# Patient Record
Sex: Female | Born: 1960 | Race: Black or African American | Hispanic: No | Marital: Married | Smoking: Former smoker
Health system: Southern US, Community
[De-identification: ages and names within clinical notes are randomized; demographics above are authoritative.]

## PROBLEM LIST (undated history)

## (undated) DIAGNOSIS — I639 Cerebral infarction, unspecified: Secondary | ICD-10-CM

## (undated) DIAGNOSIS — I10 Essential (primary) hypertension: Secondary | ICD-10-CM

## (undated) DIAGNOSIS — E119 Type 2 diabetes mellitus without complications: Secondary | ICD-10-CM

## (undated) DIAGNOSIS — E785 Hyperlipidemia, unspecified: Secondary | ICD-10-CM

## (undated) HISTORY — PX: COLONOSCOPY: SHX174

---

## 1984-01-07 HISTORY — PX: TUBAL LIGATION: SHX77

## 2003-09-08 ENCOUNTER — Other Ambulatory Visit: Admission: RE | Admit: 2003-09-08 | Discharge: 2003-09-08 | Payer: Self-pay | Admitting: Gynecology

## 2007-02-22 ENCOUNTER — Other Ambulatory Visit: Admission: RE | Admit: 2007-02-22 | Discharge: 2007-02-22 | Payer: Self-pay | Admitting: Gynecology

## 2008-10-13 ENCOUNTER — Ambulatory Visit: Payer: Self-pay | Admitting: Women's Health

## 2008-10-13 ENCOUNTER — Other Ambulatory Visit: Admission: RE | Admit: 2008-10-13 | Discharge: 2008-10-13 | Payer: Self-pay | Admitting: Gynecology

## 2008-10-13 ENCOUNTER — Encounter: Payer: Self-pay | Admitting: Women's Health

## 2009-09-07 ENCOUNTER — Emergency Department: Payer: Self-pay | Admitting: Emergency Medicine

## 2009-10-11 ENCOUNTER — Encounter: Payer: Self-pay | Admitting: Gastroenterology

## 2009-10-31 ENCOUNTER — Encounter (INDEPENDENT_AMBULATORY_CARE_PROVIDER_SITE_OTHER): Payer: Self-pay | Admitting: *Deleted

## 2009-11-05 ENCOUNTER — Ambulatory Visit: Payer: Self-pay | Admitting: Gastroenterology

## 2009-11-14 ENCOUNTER — Ambulatory Visit: Payer: Self-pay | Admitting: Gastroenterology

## 2009-11-19 ENCOUNTER — Encounter: Payer: Self-pay | Admitting: Gastroenterology

## 2010-02-05 NOTE — Letter (Signed)
Summary: Pre Visit Letter Revised  [facility]  [address] [place], [place] [address]   Phone: [phone]  Fax: [phone]        10/11/2009 MRN: [id number] [name] [address] [place], [place]  [address]             Procedure Date:  11-9 at 9:30am    Welcome to the Gastroenterology Division at [facility].    You are scheduled to see a nurse for your pre-procedure visit on 11-02-09 at 11am on the 3rd floor at [facility], [address]  We ask that you try to arrive at our office 15 minutes prior to your appointment time to allow for check-in.  Please take a minute to review the attached form.  If you answer "Yes" to one or more of the questions on the first page, we ask that you call the person listed at your earliest opportunity.  If you answer "No" to all of the questions, please complete the rest of the form and bring it to your appointment.    Your nurse visit will consist of discussing your medical and surgical history, your immediate family medical history, and your medications.   If you are unable to list all of your medications on the form, please bring the medication bottles to your appointment and we will list them.  We will need to be aware of both prescribed and over the counter drugs.  We will need to know exact dosage information as well.    Please be prepared to read and sign documents such as consent forms, a financial agreement, and acknowledgement forms.  If necessary, and with your consent, a friend or relative is welcome to sit-in on the nurse visit with you.  Please bring your insurance card so that we may make a copy of it.  If your insurance requires a referral to see a specialist, please bring your referral form from your primary care physician.  No co-pay is required for this nurse visit.     If you cannot keep your appointment, please call [phone] to cancel or reschedule prior to your appointment date.  This  allows [place] the opportunity to schedule an appointment for another patient in need of care.    Thank you for choosing [facility] for your medical needs.  We appreciate the opportunity to care for you.  Please visit [place] at our website  to learn more about our practice.  Sincerely, The Gastroenterology Division

## 2010-02-05 NOTE — Miscellaneous (Signed)
Summary: [facility] Previsit/prep  Clinical Lists Changes  Medications: Added new medication of MOVIPREP 100 GM  SOLR (PEG-KCL-NACL-NASULF-NA ASC-C) As per prep instructions. - Signed Rx of MOVIPREP 100 GM  SOLR (PEG-KCL-NACL-NASULF-NA ASC-C) As per prep instructions.;  #1 x 0;  Signed;  Entered by: Wyona Almas RN;  Authorized by: Mardella Layman MD [facility];  Method used: Electronically to [facility]. [facility] [phone]*, [address] [place], [place]  [id number], Ph: [phone], Fax: [phone] Observations: Added new observation of NKA: T (11/05/2009 13:34)    Prescriptions: MOVIPREP 100 GM  SOLR (PEG-KCL-NACL-NASULF-NA ASC-C) As per prep instructions.  #1 x 0   Entered by:   Wyona Almas RN   Authorized by:   Mardella Layman MD [facility]   Signed by:   Wyona Almas RN on 11/05/2009   Method used:   Electronically to        [facility]. [address] [phone]* (retail)       [address] [place], [place]  [id number]       Ph: [phone]       Fax: [phone]   RxID:   [id number]

## 2010-02-05 NOTE — Letter (Signed)
Summary: Patient Notice- Polyp Results   Gastroenterology  [address] [place], [place] [address]   Phone: [phone]  Fax: [phone]        November 19, 2009 MRN: [id number]    Anne Roy [address] [place], [place]  [address]    Dear Ms. Pressly,  I am pleased to inform you that the colon polyp(s) removed during your recent colonoscopy was (were) found to be benign (no cancer detected) upon pathologic examination.  I recommend you have a repeat colonoscopy examination in 1_ years to look for recurrent polyps, as having colon polyps increases your risk for having recurrent polyps or even colon cancer in the future.  Should you develop new or worsening symptoms of abdominal pain, bowel habit changes or bleeding from the rectum or bowels, please schedule an evaluation with either your primary care physician or with me.  Additional information/recommendations:  _X_ No further action with gastroenterology is needed at this time. Please      follow-up with your primary care physician for your other healthcare      needs.  __ Please call [phone] to schedule a return visit to review your      situation.  __ Please keep your follow-up visit as already scheduled.  __ Continue treatment plan as outlined the day of your exam.  Please call us if you are having persistent problems or have questions about your condition that have not been fully answered at this time.  Sincerely,  Mardella Layman MD [facility]  This letter has been electronically signed by your physician.  Appended Document: Patient Notice- Polyp Results letter mailed

## 2010-02-05 NOTE — Procedures (Signed)
Summary: Colonoscopy  Patient: [name] Note: All result statuses are Final unless otherwise noted.  Tests: (1) Colonoscopy (COL)   COL Colonoscopy           DONE (C)     [facility]     [address]     [place], [place]  [address]           COLONOSCOPY PROCEDURE REPORT           PATIENT:  Anne Roy, Anne Roy  MR#:  [id number]     BIRTHDATE:  [birth year], 49 yrs. old  GENDER:  female     ENDOSCOPIST:  Vania Rea. Jarold Motto, MD, [facility]     REF. BY:  Tomi Bamberger, NP     PROCEDURE DATE:  11/14/2009     PROCEDURE:  Colonoscopy with snare polypectomy     ASA CLASS:  Class I     INDICATIONS:  Routine Risk Screening     MEDICATIONS:   Fentanyl 50 mcg IV, Versed 6 mg IV           DESCRIPTION OF PROCEDURE:   After the risks benefits and     alternatives of the procedure were thoroughly explained, informed     consent was obtained.  Digital rectal exam was performed and     revealed no abnormalities.   The LB PCF-H180AL [id number] endoscope     was introduced through the anus and advanced to the cecum, which     was identified by both the appendix and ileocecal valve, without     limitations.  The quality of the prep was excellent, using     MoviPrep.  The instrument was then slowly withdrawn as the colon     was fully examined.     <<PROCEDUREIMAGES>>           FINDINGS:  There were multiple polyps identified and removed.     throughout the colon. 5-10 MM LAT POLYPS HOT SNARE EXCISED FROM     RIGHT AND LEFT COLON.SEE PICTURES,  This was otherwise a normal     examination of the colon.   Retroflexed views in the rectum     revealed no abnormalities.    The scope was then withdrawn from     the patient and the procedure completed.           COMPLICATIONS:  None     ENDOSCOPIC IMPRESSION:     1) Polyps, multiple throughout the colon     2) Otherwise normal examination     ?? HNPCC SYNDROME PER MULTIPLE POLYPS.F/U 1 YEAR.     RECOMMENDATIONS:     EXAM 1 YEAR.GYN EXAM IF NOT DONE.  REPEAT EXAM:  No           ______________________________     Vania Rea. Jarold Motto, MD, Clementeen Graham           CC:  Rudi Heap, MDNancy Maple Hudson, NPFuller, Darl Pikes NP           n.     REVISED:  11/14/2009 10:39 AM     eSIGNED:   Vania Rea. Teodora Baumgarten at 11/14/2009 10:39 AM           Luiz Iron, [id number]  Note: An exclamation mark (!) indicates a result that was not dispersed into the flowsheet. Document Creation Date: 11/14/2009 10:39 AM _______________________________________________________________________  (1) Order result status: Final Collection or observation date-time: 11/14/2009 09:51 Requested date-time:  Receipt date-time:  Reported date-time:  Referring Physician:  Ordering Physician: Sheryn Bison [phone]) Specimen Source:  Source: Launa Grill Order Number: [id number] Lab site:   Appended Document: Colonoscopy Follow up 1 Y  Appended Document: Colonoscopy     Procedures Next Due Date:    Colonoscopy: 11/2010

## 2010-02-05 NOTE — Letter (Signed)
Summary: [facility] Instructions  [facility]  [address] [place], [place] [address]   Phone: [phone]  Fax: [phone]       Anne Roy    March 09, 1952    MRN: [id number]        Procedure Day Anne Roy:  [facility]  11/14/09     Arrival Time:  8:30AM     Procedure Time:  9:30AM     Location of Procedure:                    [name]  [facility] (4th Floor)  PREPARATION FOR COLONOSCOPY WITH MOVIPREP   Starting 5 days prior to your procedure 11/09/09 do not eat nuts, seeds, popcorn, corn, beans, peas,  salads, or any raw vegetables.  Do not take any fiber supplements (e.g. Metamucil, Citrucel, and Benefiber).  THE DAY BEFORE YOUR PROCEDURE         DATE: 11/13/09  DAY: TUESDAY  1.  Drink clear liquids the entire day-NO SOLID FOOD  2.  Do not drink anything colored red or purple.  Avoid juices with pulp.  No orange juice.  3.  Drink at least 64 oz. (8 glasses) of fluid/clear liquids during the day to prevent dehydration and help the prep work efficiently.  CLEAR LIQUIDS INCLUDE: Water Jello Ice Popsicles Tea (sugar ok, no milk/cream) Powdered fruit flavored drinks Coffee (sugar ok, no milk/cream) Gatorade Juice: apple, white grape, white cranberry  Lemonade Clear bullion, consomm, broth Carbonated beverages (any kind) Strained chicken noodle soup Hard Candy                             4.  In the morning, mix first dose of MoviPrep solution:    Empty 1 Pouch A and 1 Pouch B into the disposable container    Add lukewarm drinking water to the top line of the container. Mix to dissolve    Refrigerate (mixed solution should be used within 24 hrs)  5.  Begin drinking the prep at 5:00 p.m. The MoviPrep container is divided by 4 marks.   Every 15 minutes drink the solution down to the next mark (approximately 8 oz) until the full liter is complete.   6.  Follow completed prep with 16 oz of clear liquid of your choice (Nothing red or purple).   Continue to drink clear liquids until bedtime.  7.  Before going to bed, mix second dose of MoviPrep solution:    Empty 1 Pouch A and 1 Pouch B into the disposable container    Add lukewarm drinking water to the top line of the container. Mix to dissolve    Refrigerate  THE DAY OF YOUR PROCEDURE      DATE: 11/14/09   DAY: WEDNESDAY  Beginning at 4:30AM (5 hours before procedure):         1. Every 15 minutes, drink the solution down to the next mark (approx 8 oz) until the full liter is complete.  2. Follow completed prep with 16 oz. of clear liquid of your choice.    3. You may drink clear liquids until 7:30AM (2 HOURS BEFORE PROCEDURE).   MEDICATION INSTRUCTIONS  Unless otherwise instructed, you should take regular prescription medications with a small sip of water   as early as possible the morning of your procedure.       OTHER INSTRUCTIONS  You will need a responsible adult at least 52  years of age to accompany you and drive you home.   This person must remain in the waiting room during your procedure.  Wear loose fitting clothing that is easily removed.  Leave jewelry and other valuables at home.  However, you may wish to bring a book to read or  an iPod/MP3 player to listen to music as you wait for your procedure to start.  Remove all body piercing jewelry and leave at home.  Total time from sign-in until discharge is approximately 2-3 hours.  You should go home directly after your procedure and rest.  You can resume normal activities the  day after your procedure.  The day of your procedure you should not:   Drive   Make legal decisions   Operate machinery   Drink alcohol   Return to work  You will receive specific instructions about eating, activities and medications before you leave.    The above instructions have been reviewed and explained to me by   Wyona Almas RN  November 05, 2009 2:37 PM     I fully understand and can verbalize these  instructions _____________________________ Date _________

## 2011-01-17 ENCOUNTER — Other Ambulatory Visit (HOSPITAL_COMMUNITY)
Admission: RE | Admit: 2011-01-17 | Discharge: 2011-01-17 | Disposition: A | Discharge status: Home / Self Care | Payer: BC Managed Care – PPO | Source: Ambulatory Visit | Attending: Women's Health | Admitting: Women's Health

## 2011-01-17 ENCOUNTER — Encounter: Payer: Self-pay | Admitting: Women's Health

## 2011-01-17 ENCOUNTER — Ambulatory Visit (INDEPENDENT_AMBULATORY_CARE_PROVIDER_SITE_OTHER): Payer: BC Managed Care – PPO | Admitting: Women's Health

## 2011-01-17 VITALS — BP 110/70 | Ht 62.5 in | Wt 119.0 lb

## 2011-01-17 DIAGNOSIS — Z01419 Encounter for gynecological examination (general) (routine) without abnormal findings: Secondary | ICD-10-CM | POA: Insufficient documentation

## 2011-01-17 DIAGNOSIS — Z780 Asymptomatic menopausal state: Secondary | ICD-10-CM

## 2011-01-17 NOTE — Progress Notes (Signed)
Anne Roy 10-23-1960 [id number]    History:    The patient presents for annual exam.  Postmenopausal for greater than 2 years/BTL with no bleeding or HRT. No complaints. History of normal Paps and is overdue for mammogram. Has not had a bone density. Colonoscopy in 2011 with a benign polyp.   Past medical history, past surgical history, family history and social history were all reviewed and documented in the EPIC chart.   ROS:  A  ROS was performed and pertinent positives and negatives are included in the history.  Exam:  Filed Vitals:   01/17/11 1017  BP: 110/70    General appearance:  Normal Head/Neck:  Normal, without cervical or supraclavicular adenopathy. Thyroid:  Symmetrical, normal in size, without palpable masses or nodularity. Respiratory  Effort:  Normal  Auscultation:  Clear without wheezing or rhonchi Cardiovascular  Auscultation:  Regular rate, without rubs, murmurs or gallops  Edema/varicosities:  Not grossly evident Abdominal  Soft,nontender, without masses, guarding or rebound.  Liver/spleen:  No organomegaly noted  Hernia:  None appreciated  Skin  Inspection:  Grossly normal  Palpation:  Grossly normal Neurologic/psychiatric  Orientation:  Normal with appropriate conversation.  Mood/affect:  Normal  Genitourinary    Breasts: Examined lying and sitting.     Right: Without masses, retractions, discharge or axillary adenopathy.     Left: Without masses, retractions, discharge or axillary adenopathy.   Inguinal/mons:  Normal without inguinal adenopathy  External genitalia:  Normal  BUS/Urethra/Skene's glands:  Normal  Bladder:  Normal  Vagina:  Normal  Cervix:  Normal  Uterus:   normal in size, shape and contour.  Midline and mobile  Adnexa/parametria:     Rt: Without masses or tenderness.   Lt: Without masses or tenderness.  Anus and perineum: Normal  Digital rectal exam: Normal sphincter tone without palpated masses or  tenderness  Assessment/Plan:  51 y.o. MBF G2 P2 for annual exam without complaint.  Normal postmenopausal exam on no HRT  Plan: Schedule mammogram, continue SBEs, increase regular exercise, calcium rich diet encouraged, vitamin D 2000 daily encouraged. Schedule DEXA here. Fall prevention and home safety discussed. Pap only. Had normal labs at primary care per patient.    Harrington Challenger [facility], 12:43 PM 01/17/2011

## 2011-01-17 NOTE — Patient Instructions (Signed)
Vit D 2000 daily  Schedule mammogram

## 2011-02-19 ENCOUNTER — Encounter: Payer: Self-pay | Admitting: Gastroenterology

## 2011-10-01 ENCOUNTER — Encounter: Payer: Self-pay | Admitting: Gastroenterology

## 2013-11-07 ENCOUNTER — Encounter: Payer: Self-pay | Admitting: Women's Health

## 2014-12-04 ENCOUNTER — Encounter: Payer: Self-pay | Admitting: Gastroenterology

## 2015-06-11 ENCOUNTER — Emergency Department: Payer: Medicaid Other

## 2015-06-11 ENCOUNTER — Inpatient Hospital Stay
Admission: EM | Admit: 2015-06-11 | Discharge: 2015-06-13 | DRG: 066 | Disposition: A | Discharge status: Home / Self Care | Payer: Medicaid Other | Attending: Internal Medicine | Admitting: Internal Medicine

## 2015-06-11 ENCOUNTER — Inpatient Hospital Stay: Payer: Medicaid Other

## 2015-06-11 DIAGNOSIS — R29702 NIHSS score 2: Secondary | ICD-10-CM | POA: Diagnosis present

## 2015-06-11 DIAGNOSIS — R4781 Slurred speech: Secondary | ICD-10-CM | POA: Diagnosis present

## 2015-06-11 DIAGNOSIS — Z87891 Personal history of nicotine dependence: Secondary | ICD-10-CM | POA: Diagnosis not present

## 2015-06-11 DIAGNOSIS — I6350 Cerebral infarction due to unspecified occlusion or stenosis of unspecified cerebral artery: Secondary | ICD-10-CM | POA: Diagnosis not present

## 2015-06-11 DIAGNOSIS — R402142 Coma scale, eyes open, spontaneous, at arrival to emergency department: Secondary | ICD-10-CM | POA: Diagnosis present

## 2015-06-11 DIAGNOSIS — R402252 Coma scale, best verbal response, oriented, at arrival to emergency department: Secondary | ICD-10-CM | POA: Diagnosis present

## 2015-06-11 DIAGNOSIS — R269 Unspecified abnormalities of gait and mobility: Secondary | ICD-10-CM | POA: Diagnosis present

## 2015-06-11 DIAGNOSIS — E876 Hypokalemia: Secondary | ICD-10-CM | POA: Diagnosis present

## 2015-06-11 DIAGNOSIS — R402362 Coma scale, best motor response, obeys commands, at arrival to emergency department: Secondary | ICD-10-CM | POA: Diagnosis present

## 2015-06-11 DIAGNOSIS — I639 Cerebral infarction, unspecified: Principal | ICD-10-CM | POA: Diagnosis present

## 2015-06-11 DIAGNOSIS — R29810 Facial weakness: Secondary | ICD-10-CM | POA: Diagnosis present

## 2015-06-11 DIAGNOSIS — I10 Essential (primary) hypertension: Secondary | ICD-10-CM | POA: Diagnosis present

## 2015-06-11 DIAGNOSIS — D72829 Elevated white blood cell count, unspecified: Secondary | ICD-10-CM | POA: Diagnosis present

## 2015-06-11 LAB — COMPREHENSIVE METABOLIC PANEL
ALT: 18 U/L (ref 14–54)
AST: 18 U/L (ref 15–41)
Albumin: 3.7 g/dL (ref 3.5–5.0)
Alkaline Phosphatase: 93 U/L (ref 38–126)
Anion gap: 9 (ref 5–15)
BILIRUBIN TOTAL: 0.4 mg/dL (ref 0.3–1.2)
BUN: 12 mg/dL (ref 6–20)
CO2: 24 mmol/L (ref 22–32)
Calcium: 9 mg/dL (ref 8.9–10.3)
Chloride: 103 mmol/L (ref 101–111)
Creatinine, Ser: 0.83 mg/dL (ref 0.44–1.00)
Glucose, Bld: 108 mg/dL — ABNORMAL HIGH (ref 65–99)
POTASSIUM: 3.1 mmol/L — AB (ref 3.5–5.1)
Sodium: 136 mmol/L (ref 135–145)
TOTAL PROTEIN: 6.7 g/dL (ref 6.5–8.1)

## 2015-06-11 LAB — DIFFERENTIAL
BASOS ABS: 0.1 10*3/uL (ref 0–0.1)
Basophils Relative: 1 %
EOS ABS: 0.1 10*3/uL (ref 0–0.7)
LYMPHS ABS: 7.1 10*3/uL — AB (ref 1.0–3.6)
Lymphocytes Relative: 42 %
MONO ABS: 1 10*3/uL — AB (ref 0.2–0.9)
Neutro Abs: 8.6 10*3/uL — ABNORMAL HIGH (ref 1.4–6.5)
Neutrophils Relative %: 50 %

## 2015-06-11 LAB — LIPID PANEL
CHOLESTEROL: 189 mg/dL (ref 0–200)
HDL: 34 mg/dL — AB (ref >40)
LDL Cholesterol: 121 mg/dL — ABNORMAL HIGH (ref 0–99)
Total CHOL/HDL Ratio: 5.6 RATIO
Triglycerides: 168 mg/dL — ABNORMAL HIGH (ref <150)
VLDL: 34 mg/dL (ref 0–40)

## 2015-06-11 LAB — CBC
HEMATOCRIT: 41.4 % (ref 35.0–47.0)
HEMOGLOBIN: 13.3 g/dL (ref 12.0–16.0)
MCH: 25.7 pg — AB (ref 26.0–34.0)
MCHC: 32.1 g/dL (ref 32.0–36.0)
MCV: 80 fL (ref 80.0–100.0)
Platelets: 358 10*3/uL (ref 150–440)
RBC: 5.18 MIL/uL (ref 3.80–5.20)
RDW: 15.1 % — AB (ref 11.5–14.5)
WBC: 16.8 10*3/uL — ABNORMAL HIGH (ref 3.6–11.0)

## 2015-06-11 LAB — APTT: aPTT: 26 seconds (ref 24–36)

## 2015-06-11 LAB — PROTIME-INR
INR: 1.01
Prothrombin Time: 13.5 seconds (ref 11.4–15.0)

## 2015-06-11 LAB — TROPONIN I

## 2015-06-11 MED ORDER — ONDANSETRON HCL 4 MG PO TABS: 4.0000 mg | ORAL_TABLET | Freq: Four times a day (QID) | ORAL | Status: DC | PRN

## 2015-06-11 MED ORDER — POTASSIUM CHLORIDE CRYS ER 20 MEQ PO TBCR
EXTENDED_RELEASE_TABLET | ORAL | Status: AC
  Administered 2015-06-11: 40 meq via ORAL
  Filled 2015-06-11: qty 2

## 2015-06-11 MED ORDER — ACETAMINOPHEN 650 MG RE SUPP: 650.0000 mg | Freq: Four times a day (QID) | RECTAL | Status: DC | PRN

## 2015-06-11 MED ORDER — SODIUM CHLORIDE 0.9 % IV SOLN
INTRAVENOUS | Status: AC
  Administered 2015-06-11: 22:00:00 via INTRAVENOUS

## 2015-06-11 MED ORDER — SODIUM CHLORIDE 0.9% FLUSH
3.0000 mL | Freq: Two times a day (BID) | INTRAVENOUS | Status: DC
  Administered 2015-06-11 – 2015-06-13 (×3): 3 mL via INTRAVENOUS

## 2015-06-11 MED ORDER — ADULT MULTIVITAMIN W/MINERALS CH
1.0000 | ORAL_TABLET | Freq: Every day | ORAL | Status: DC
  Administered 2015-06-11 – 2015-06-13 (×3): 1 via ORAL
  Filled 2015-06-11 (×3): qty 1

## 2015-06-11 MED ORDER — ASPIRIN 81 MG PO CHEW
324.0000 mg | CHEWABLE_TABLET | Freq: Once | ORAL | Status: AC
  Administered 2015-06-11: 324 mg via ORAL
  Filled 2015-06-11: qty 4

## 2015-06-11 MED ORDER — ACETAMINOPHEN 325 MG PO TABS: 650.0000 mg | ORAL_TABLET | Freq: Four times a day (QID) | ORAL | Status: DC | PRN

## 2015-06-11 MED ORDER — ATORVASTATIN CALCIUM 20 MG PO TABS
40.0000 mg | ORAL_TABLET | Freq: Every day | ORAL | Status: DC
  Administered 2015-06-11 – 2015-06-12 (×2): 40 mg via ORAL
  Filled 2015-06-11: qty 2

## 2015-06-11 MED ORDER — ENOXAPARIN SODIUM 40 MG/0.4ML ~~LOC~~ SOLN
40.0000 mg | SUBCUTANEOUS | Status: DC
  Administered 2015-06-11 – 2015-06-12 (×2): 40 mg via SUBCUTANEOUS
  Filled 2015-06-11 (×2): qty 0.4

## 2015-06-11 MED ORDER — POTASSIUM CHLORIDE CRYS ER 20 MEQ PO TBCR
40.0000 meq | EXTENDED_RELEASE_TABLET | Freq: Once | ORAL | Status: AC
  Administered 2015-06-11: 40 meq via ORAL

## 2015-06-11 MED ORDER — ATORVASTATIN CALCIUM 20 MG PO TABS
ORAL_TABLET | ORAL | Status: AC
  Administered 2015-06-11: 40 mg via ORAL
  Filled 2015-06-11: qty 2

## 2015-06-11 MED ORDER — ASPIRIN 81 MG PO CHEW
81.0000 mg | CHEWABLE_TABLET | Freq: Every day | ORAL | Status: DC
  Administered 2015-06-12 – 2015-06-13 (×2): 81 mg via ORAL
  Filled 2015-06-11 (×2): qty 1

## 2015-06-11 MED ORDER — ONDANSETRON HCL 4 MG/2ML IJ SOLN: 4.0000 mg | Freq: Four times a day (QID) | INTRAMUSCULAR | Status: DC | PRN

## 2015-06-11 NOTE — H&P (Signed)
[place] [facility] [place] NAME: Anne Roy    MR#:  [id number]  DATE OF BIRTH:  1960/04/18  DATE OF ADMISSION:  06/11/2015  PRIMARY CARE PHYSICIAN: No primary care provider on file.   REQUESTING/REFERRING PHYSICIAN: Dr. Inez Catalina  CHIEF COMPLAINT:   Chief Complaint  Patient presents with  . Facial Droop  . Gait Problem    HISTORY OF PRESENT ILLNESS:  Anne Roy  is a 55 y.o. female with no significant PMH Comes to the emergency room secondary to balance problems and also slurred speech noted last night. Patient doesn't have any known past medical history and is not seeing any physician. She stays at home and has several family members to attend to her. Recently she had nausea vomiting and upper GI illness. She was noted to be weak and off balance yesterday while walking which the family thought that it was secondary to weakness from her recent gastroenteritis. Her speech remained thick and slurred today, no word finding difficulties and she was noted to have a right facial droop and so brought to the emergency room. Denies any sensory changes or motor changes. She is right-handed. 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 Patient is being admitted for further stroke workup.  PAST MEDICAL HISTORY:  History reviewed. No pertinent past medical history.  PAST SURGICAL HISTORY:   Past Surgical History  Procedure Laterality Date  . Tubal ligation  1986    SOCIAL HISTORY:   Social History  Substance Use Topics  . Smoking status: Former Research scientist (life sciences)  . Smokeless tobacco: Never Used     Comment: Quit > 20 years ago  . Alcohol Use: No    FAMILY HISTORY:   Family History  Problem Relation Age of Onset  . Diabetes Mother   . Hypertension Mother   . Diabetes Father   . Hypertension Father     DRUG ALLERGIES:  No Known Allergies  REVIEW OF SYSTEMS:   Review of Systems  Constitutional: Positive for  malaise/fatigue. Negative for fever, chills and weight loss.  HENT: Negative for ear discharge, ear pain, hearing loss and nosebleeds.   Eyes: Negative for blurred vision, double vision and photophobia.  Respiratory: Negative for cough, hemoptysis, shortness of breath and wheezing.   Cardiovascular: Negative for chest pain, palpitations, orthopnea and leg swelling.  Gastrointestinal: Negative for heartburn, nausea, vomiting, abdominal pain, diarrhea, constipation and melena.  Genitourinary: Negative for dysuria, urgency, frequency and hematuria.  Musculoskeletal: Positive for myalgias. Negative for back pain and neck pain.  Skin: Negative for rash.  Neurological: Positive for speech change. Negative for dizziness, tingling, tremors, sensory change, focal weakness and headaches.  Endo/Heme/Allergies: Does not bruise/bleed easily.  Psychiatric/Behavioral: Negative for depression.    MEDICATIONS AT HOME:   Prior to Admission medications   Medication Sig Start Date End Date Taking? Authorizing Provider  Multiple Vitamins-Minerals (MULTIVITAMIN PO) Take by mouth.      Historical Provider, MD      VITAL SIGNS:  Blood pressure 162/96, pulse 97, temperature 98.3 F (36.8 C), temperature source Oral, resp. rate 16, height 5\' 3"  (1.6 m), weight 49.896 kg (110 lb), SpO2 98 %.  PHYSICAL EXAMINATION:   Physical Exam  GENERAL:  55 y.o.-year-old patient lying in the bed with no acute distress.  EYES: Pupils equal, round, reactive to light and accommodation. No scleral icterus. Extraocular muscles intact. Right facial droop noted. HEENT: Head atraumatic, normocephalic. Oropharynx and nasopharynx clear.  NECK:  Supple, no jugular venous distention. No thyroid enlargement, no tenderness.  LUNGS: Normal breath sounds bilaterally, no wheezing, rales,rhonchi or crepitation. No use of accessory muscles of respiration.  CARDIOVASCULAR: S1, S2 normal. No murmurs, rubs, or gallops.  ABDOMEN: Soft,  nontender, nondistended. Bowel sounds present. No organomegaly or mass.  EXTREMITIES: No pedal edema, cyanosis, or clubbing. Homans sign negative. NEUROLOGIC: Cranial nerves II through XII are intact. Muscle strength 5/5 in all extremities. Sensation intact. Gait not checked.  PSYCHIATRIC: The patient is alert and oriented x 3.  SKIN: No obvious rash, lesion, or ulcer.   LABORATORY PANEL:   CBC  Recent Labs Lab 06/11/15 1511  WBC 16.8*  HGB 13.3  HCT 41.4  PLT 358   ------------------------------------------------------------------------------------------------------------------  Chemistries   Recent Labs Lab 06/11/15 1511  NA 136  K 3.1*  CL 103  CO2 24  GLUCOSE 108*  BUN 12  CREATININE 0.83  CALCIUM 9.0  AST 18  ALT 18  ALKPHOS 93  BILITOT 0.4   ------------------------------------------------------------------------------------------------------------------  Cardiac Enzymes  Recent Labs Lab 06/11/15 1511  TROPONINI <0.03   ------------------------------------------------------------------------------------------------------------------  RADIOLOGY:  Ct Head Wo Contrast  06/11/2015  CLINICAL DATA:  Slurred speech, off balance since yesterday. Right facial droop. EXAM: CT HEAD WITHOUT CONTRAST TECHNIQUE: Contiguous axial images were obtained from the base of the skull through the vertex without intravenous contrast. COMPARISON:  None. FINDINGS: There is no evidence of mass effect, midline shift or extra-axial fluid collections. There is no evidence of a space-occupying lesion or intracranial hemorrhage. There is no evidence of a cortical-based area of acute infarction. There is an acute versus subacute left basal ganglia lacunar infarct. The ventricles and sulci are appropriate for the patient's age. The basal cisterns are patent. Visualized portions of the orbits are unremarkable. The visualized portions of the paranasal sinuses and mastoid air cells are  unremarkable. The osseous structures are unremarkable. IMPRESSION: 1.  Acute versus subacute left basal ganglia lacunar infarct. Electronically Signed   By: Kathreen Devoid   On: 06/11/2015 16:09    EKG:   Orders placed or performed during the hospital encounter of 06/11/15  . ED EKG  . ED EKG    IMPRESSION AND PLAN:   Anne Roy  is a 55 y.o. female with no significant PMH Comes to the emergency room secondary to balance problems and also slurred speech noted last night.  #1 acute CVA-CT showing left basal ganglion infarct. Patient with right facial droop and slurred speech. -Admit, oxygen and telemetry med 3. MRI of the brain and carotid Dopplers and 2-D echocardiogram. -Neuro checks. Neurology consult. -Physical therapy, occupational therapy and speech therapy consults. -Started on aspirin and statin. Lipid panel is pending.  #2 hypokalemia-being replaced  #3 leukocytosis-could be stress-induced, check urine analysis.  #4 DVT prophylaxis-on Lovenox    All the records are reviewed and case discussed with ED provider. Management plans discussed with the patient, family and they are in agreement.  CODE STATUS: Full Code  TOTAL TIME TAKING CARE OF THIS PATIENT: 50 minutes.    Gladstone Lighter M.D on 06/11/2015 at 5:23 PM  Between 7am to 6pm - Pager - [phone]  After 6pm go to [email or web address] - password [email or web address] [facility]  [facility]  Office  [phone]  CC: Primary care physician; No primary care provider on file.

## 2015-06-11 NOTE — ED Provider Notes (Signed)
[facility] Emergency Department Provider Note    ____________________________________________  Time seen: ~1655  I have reviewed the triage vital signs and the nursing notes.   HISTORY  Chief Complaint Facial Droop and Gait Problem   History limited by: Not Limited   HPI Anne Roy is a 55 y.o. female who presents to the emergency department today accompanied by daughter because of concerns for slurred speech, facial droop and difficulty with ambulation. The patient states that for the past couple of weeks she's been having intermittent headaches. Has been located behind her eyes. The addition she has been having some pain in her right calf. Yesterday she became very dizzy and was unable and really when she stood up. Today the daughter noticed that the patient was having some slurred speech and concern for potentially facial droop. The daughter talked to the son on the phone he noticed some slurred speech yesterday evening. Patient herself does not appreciate that she slurring her speech. Denies any current headaches. Denies any current chest pain palpitations or fevers.   Past Medical History  Diagnosis Date  . Decreased appetite     There are no active problems to display for this patient.   Past Surgical History  Procedure Laterality Date  . Tubal ligation  1986    Current Outpatient Rx  Name  Route  Sig  Dispense  Refill  . Multiple Vitamins-Minerals (MULTIVITAMIN PO)   Oral   Take by mouth.             Allergies Review of patient's allergies indicates no known allergies.  Family History  Problem Relation Age of Onset  . Diabetes Mother   . Hypertension Mother   . Diabetes Father   . Hypertension Father     Social History Social History  Substance Use Topics  . Smoking status: Never Smoker   . Smokeless tobacco: Never Used  . Alcohol Use: No    Review of Systems  Constitutional: Negative for fever. Cardiovascular:  Negative for chest pain. Respiratory: Negative for shortness of breath. Gastrointestinal: Negative for abdominal pain, vomiting and diarrhea. Neurological: Positive for slurred speech. Positive for facial droop.   10-point ROS otherwise negative.  ____________________________________________   PHYSICAL EXAM:  VITAL SIGNS: ED Triage Vitals  Enc Vitals Group     BP 06/11/15 1500 162/96 mmHg     Pulse Rate 06/11/15 1500 97     Resp 06/11/15 1500 16     Temp 06/11/15 1500 98.3 F (36.8 C)     Temp Source 06/11/15 1500 Oral     SpO2 06/11/15 1500 98 %     Weight 06/11/15 1500 110 lb (49.896 kg)     Height 06/11/15 1500 5\' 3"  (1.6 m)   Constitutional: Alert and oriented. Well appearing and in no distress. Eyes: Conjunctivae are normal. PERRL. Normal extraocular movements. ENT   Head: Normocephalic and atraumatic.   Nose: No congestion/rhinnorhea.   Mouth/Throat: Mucous membranes are moist.   Neck: No stridor. Hematological/Lymphatic/Immunilogical: No cervical lymphadenopathy. Cardiovascular: Normal rate, regular rhythm.  No murmurs, rubs, or gallops. Respiratory: Normal respiratory effort without tachypnea nor retractions. Breath sounds are clear and equal bilaterally. No wheezes/rales/rhonchi. Gastrointestinal: Soft and nontender. No distention. Genitourinary: Deferred Musculoskeletal: Normal range of motion in all extremities. No joint effusions.  No lower extremity tenderness nor edema. Neurologic:  Speech does appear somewhat slurred. Right-sided facial droop. Tongue deviates to the right. PERRL. Extraocular motions intact. No pronator drift. No lower extremity drift. Sensation  grossly intact. Skin:  Skin is warm, dry and intact. No rash noted. Psychiatric: Mood and affect are normal. Speech and behavior are normal. Patient exhibits appropriate insight and judgment.  ____________________________________________    LABS (pertinent positives/negatives)  Labs  Reviewed  CBC - Abnormal; Notable for the following:    WBC 16.8 (*)    MCH 25.7 (*)    RDW 15.1 (*)    All other components within normal limits  DIFFERENTIAL - Abnormal; Notable for the following:    Neutro Abs 8.6 (*)    Lymphs Abs 7.1 (*)    Monocytes Absolute 1.0 (*)    All other components within normal limits  COMPREHENSIVE METABOLIC PANEL - Abnormal; Notable for the following:    Potassium 3.1 (*)    Glucose, Bld 108 (*)    All other components within normal limits  PROTIME-INR  APTT  TROPONIN I  CBG MONITORING, ED     ____________________________________________   EKG  I, Nance Pear, attending physician, personally viewed and interpreted this EKG  EKG Time: 1522 Rate: 91 Rhythm: normal sinus rhythm Axis: normal Intervals: qtc 408 QRS: narrow ST changes: no st elevation Impression: normal ekg   ____________________________________________    RADIOLOGY  CT head  IMPRESSION: 1. Acute versus subacute left basal ganglia lacunar infarct.   ____________________________________________   PROCEDURES  Procedure(s) performed: None  Critical Care performed: No  ____________________________________________   INITIAL IMPRESSION / ASSESSMENT AND PLAN / ED COURSE  Pertinent labs & imaging results that were available during my care of the patient were reviewed by me and considered in my medical decision making (see chart for details).  Patient presented to the emergency department today because of concerns for slurred speech and facial droop. CT of the head does show an acute or subacute infarct. Given that the symptoms started yesterday evening the patient is well outside of the window for any TPA. We will however give a full dose aspirin and plan on admission to the hospital service for further stroke workup.  ____________________________________________   FINAL CLINICAL IMPRESSION(S) / ED DIAGNOSES  Final diagnoses:  CVA (cerebral  infarction)     Note: This dictation was prepared with Dragon dictation. Any transcriptional errors that result from this process are unintentional    Nance Pear, MD 06/11/15 1709

## 2015-06-11 NOTE — ED Notes (Signed)
Patient assisted to commode. Educated on use of call bell located beside of the toilet. Daughter remains at bedside.

## 2015-06-11 NOTE — ED Notes (Signed)
Pt is here with her daughter, states last week she was sick with N/V abd pain, states yesterday she was off balance with slurred speech.. States today has noted right facial droop.. Pt denies any numbness or blurred vision.

## 2015-06-12 ENCOUNTER — Inpatient Hospital Stay (HOSPITAL_COMMUNITY)
Admit: 2015-06-12 | Discharge: 2015-06-12 | Disposition: A | Discharge status: Home / Self Care | Payer: Medicaid Other | Attending: Internal Medicine | Admitting: Internal Medicine

## 2015-06-12 DIAGNOSIS — I639 Cerebral infarction, unspecified: Principal | ICD-10-CM

## 2015-06-12 DIAGNOSIS — I6350 Cerebral infarction due to unspecified occlusion or stenosis of unspecified cerebral artery: Secondary | ICD-10-CM

## 2015-06-12 LAB — CBC
HCT: 38.4 % (ref 35.0–47.0)
HEMOGLOBIN: 12.7 g/dL (ref 12.0–16.0)
MCH: 25.8 pg — ABNORMAL LOW (ref 26.0–34.0)
MCHC: 32.9 g/dL (ref 32.0–36.0)
MCV: 78.4 fL — AB (ref 80.0–100.0)
PLATELETS: 333 10*3/uL (ref 150–440)
RBC: 4.9 MIL/uL (ref 3.80–5.20)
RDW: 14.9 % — ABNORMAL HIGH (ref 11.5–14.5)
WBC: 14.3 10*3/uL — AB (ref 3.6–11.0)

## 2015-06-12 LAB — URINALYSIS COMPLETE WITH MICROSCOPIC (ARMC ONLY)
Bilirubin Urine: NEGATIVE
GLUCOSE, UA: NEGATIVE mg/dL
Ketones, ur: NEGATIVE mg/dL
Leukocytes, UA: NEGATIVE
Nitrite: NEGATIVE
Protein, ur: NEGATIVE mg/dL
Specific Gravity, Urine: 1.006 (ref 1.005–1.030)
pH: 8 (ref 5.0–8.0)

## 2015-06-12 LAB — HEMOGLOBIN A1C: HEMOGLOBIN A1C: 5.8 % (ref 4.0–6.0)

## 2015-06-12 LAB — BASIC METABOLIC PANEL
ANION GAP: 5 (ref 5–15)
BUN: 11 mg/dL (ref 6–20)
CO2: 25 mmol/L (ref 22–32)
CREATININE: 0.71 mg/dL (ref 0.44–1.00)
Calcium: 8.5 mg/dL — ABNORMAL LOW (ref 8.9–10.3)
Chloride: 108 mmol/L (ref 101–111)
GFR calc non Af Amer: >60 mL/min (ref >60)
Glucose, Bld: 107 mg/dL — ABNORMAL HIGH (ref 65–99)
POTASSIUM: 3.6 mmol/L (ref 3.5–5.1)
SODIUM: 138 mmol/L (ref 135–145)

## 2015-06-12 LAB — ECHOCARDIOGRAM COMPLETE
HEIGHTINCHES: 63 in
Weight: 1928 oz

## 2015-06-12 MED ORDER — AMLODIPINE BESYLATE 5 MG PO TABS
5.0000 mg | ORAL_TABLET | Freq: Every day | ORAL | Status: DC
Start: 2015-06-12 — End: 2015-06-13
  Administered 2015-06-12: 5 mg via ORAL
  Filled 2015-06-12: qty 1

## 2015-06-12 MED ORDER — HYDRALAZINE HCL 20 MG/ML IJ SOLN: 10.0000 mg | Freq: Four times a day (QID) | INTRAMUSCULAR | Status: DC | PRN

## 2015-06-12 MED ORDER — HYDRALAZINE HCL 25 MG PO TABS
25.0000 mg | ORAL_TABLET | Freq: Three times a day (TID) | ORAL | Status: DC
  Administered 2015-06-12 – 2015-06-13 (×3): 25 mg via ORAL
  Filled 2015-06-12 (×3): qty 1

## 2015-06-12 NOTE — Care Management (Signed)
Found that patient even though lives in [place]- she resides and [facility] so is not eligible for [employer].  Would be eligible for [facility].  Will anticipate assistance from [facility] at least at discharge.  .  She is not going to discharge home today as blood pressure remains unstable.

## 2015-06-12 NOTE — Progress Notes (Signed)
Occupational Therapy Treatment Patient Details Name: JATYRA YOHMAN MRN: [id number] DOB: September 02, 1960 Today's Date: 06/12/2015    History of present illness Pt admitted for positive L CVA in basal ganglia. Pt with complaints of balance/slurred speech. Pt with recent gastroenteritis.    OT comments  Pt. Is a 55 y.o. Female who was admitted with Left CVA. Pt. Presents with impaired strength, weakness, standing balance during ADLs, and functional mobility which hinder her ability to complete ADL and IADL functioning. Pt. Could benefit from continued skilled OT services for ADL and IADL retraining, UE ther. Ex, and functional mobility to improve ADL functioning.   Follow Up Recommendations  Home Health OT/Outpatient OT    Equipment Recommendations       Recommendations for Other Services Rehab consult    Precautions / Restrictions Precautions Precautions: Fall Restrictions Weight Bearing Restrictions: No       Mobility Bed Mobility Overal bed mobility: Independent             General bed mobility comments: pt very impulsive and tries to get out of bed prior to therapist instruction. Once seated at EOB, pt able to sit with safe techniqu  Transfers Overall transfer level: Needs assistance Equipment used: None Transfers: Sit to/from Stand Sit to Stand: Min guard            Balance Overall balance assessment: Needs assistance Sitting-balance support: Feet supported Sitting balance-Leahy Scale: Normal     Standing balance support: Single extremity supported Standing balance-Leahy Scale: Fair                     ADL Overall ADL's : Needs assistance/impaired     Grooming: Set up;Minimal assistance;Standing               Lower Body Dressing: Min guard;Supervision/safety               Functional mobility during ADLs: Min guard        Vision                     Perception     Praxis      Cognition   Behavior During Therapy: [facility]  for tasks assessed/performed;Impulsive Overall Cognitive Status: Within Functional Limits for tasks assessed                       Extremity/Trunk Assessment  Upper Extremity Assessment Upper Extremity Assessment: Generalized weakness        Exercises    Shoulder Instructions       General Comments      Pertinent Vitals/ Pain       Pain Assessment: 0-10 Pain Score: 0-No pain  Home Living Family/patient expects to be discharged to:: Private residence Living Arrangements: Spouse/significant other Available Help at Discharge: Family Type of Home: House Home Access: Level entry     Home Layout: One level     Bathroom Shower/Tub: Door;Tub/shower unit Shower/tub characteristics: Door       Home Equipment: None          Prior Functioning/Environment Level of Independence: Independent            Frequency Min 1X/week     Progress Toward Goals  OT Goals(current goals can now be found in the care plan section)     Acute Rehab OT Goals Patient Stated Goal: to get back her strength OT Goal Formulation: With patient/family Potential to Achieve Goals: Good  Plan  Co-evaluation                 End of Session Equipment Utilized During Treatment: Gait belt;Rolling walker   Activity Tolerance Patient tolerated treatment well   Patient Left in chair;with chair alarm set   Nurse Communication          Time: 1420-1440 OT Time Calculation (min): 20 min  Charges: OT General Charges $OT Visit: 1 Procedure OT Evaluation $OT Eval Moderate Complexity: 1 Procedure   Harrel Carina, MS, OTR/L  Harrel Carina 06/12/2015, 5:30 PM

## 2015-06-12 NOTE — Care Management (Addendum)
Patient presents from home and is without payor.  She does not work but her husband is employed. Patient does not have pcp.  Provided her with application fr [facility].  It was discussed that she have home health physical therapy.  She does not qualify for charity care with [facility] as she has access to 8000 dollars of credit on 2 credit cards.  Explained this to the patient and she will speak with her husband before deciding to accept the home health physical therapy. Later realized that without pcp, patient can not be followed for home health so made [facility] Referral with patient's permission.  Emphasized the urgency of completion of [facility] application.  Provided patient with list of physicians taking new patient's and sliding scale clinics.  Looking to see if patient would meet hri criteria

## 2015-06-12 NOTE — Progress Notes (Signed)
OT Cancellation Note  Patient Details Name: Anne Roy MRN: [id number] DOB: Apr 29, 1960   Cancelled Treatment:    Reason Eval/Treat Not Completed: Patient at procedure or test/ unavailable (Pt. with PT upon attempt for OT initial eval)  >Olin Gurski, MS, OTR/L  Harrel Carina 06/12/2015, 2:18 PM

## 2015-06-12 NOTE — Care Management (Signed)
patient does not meet hri criteria per physician advisor.

## 2015-06-12 NOTE — Evaluation (Signed)
Clinical/Bedside Swallow Evaluation Patient Details  Name: Anne Roy MRN: [id number] Date of Birth: 08-Apr-1960  Today's Date: 06/12/2015 Time: SLP Start Time (ACUTE ONLY): [id number] SLP Stop Time (ACUTE ONLY): 0955 SLP Time Calculation (min) (ACUTE ONLY): 60 min  Past Medical History: History reviewed. No pertinent past medical history. Past Surgical History:  Past Surgical History  Procedure Laterality Date  . Tubal ligation  1986   HPI:      Assessment / Plan / Recommendation Clinical Impression  Pt appears at reduced risk for aspiration following general aspiration precautions. Pt consumed po trials w/ no overt s/s of aspiration or decline in status noted; oral phase grossly wfl w/ trials taking her time w/ bread items during mastication/clearing. Pt does exhibit R labial decreased tone/ROM but w/ increased effort, she is able to attain increased ROM. Speech is intelligible but slightly "thicker" sounding; wearing her dentures increases tone and articulation. Recommend continue a regular diet consistency w/ general aspiration precautions. Recommend ST f/u Outpt for further assessment and tx of Dysarthria to improve speech articulation in ADLs. Results discussed w/ pt/Son and MD/CM.     Aspiration Risk   (reduced )    Diet Recommendation  regular (meats cut small, moistened w/ gravy); thin liquids; aspiration precautions.  Medication Administration: Whole meds with liquid (or w/ puree for easier swallowing if desires)    Other  Recommendations Oral Care Recommendations: Oral care BID;Patient independent with oral care   Follow up Recommendations  Home health SLP;Outpatient SLP (TBD) for Dysarthria assessment/tx   Frequency and Duration            Prognosis Prognosis for Safe Diet Advancement: Good Barriers to Reach Goals:  (none)      Swallow Study   General Date of Onset: 06/11/15 Type of Study: Bedside Swallow Evaluation Previous Swallow Assessment: none Diet Prior to  this Study: Regular;Thin liquids Temperature Spikes Noted: No (wbc elevated but trending downward) Respiratory Status: Room air History of Recent Intubation: No Behavior/Cognition: Alert;Cooperative;Pleasant mood Oral Cavity Assessment: Within Functional Limits Oral Care Completed by SLP: Recent completion by staff Oral Cavity - Dentition: Dentures, top;Dentures, bottom (in place) Vision: Functional for self-feeding Self-Feeding Abilities: Able to feed self;Needs assist;Needs set up (R UE weakness, decreased coordination) Patient Positioning: Upright in bed Baseline Vocal Quality: Normal Volitional Cough: Strong Volitional Swallow: Able to elicit    Oral/Motor/Sensory Function Overall Oral Motor/Sensory Function: Mild impairment Facial ROM: Reduced right Facial Symmetry: Abnormal symmetry right Facial Strength: Reduced right (labial) Facial Sensation: Within Functional Limits Lingual ROM: Within Functional Limits Lingual Symmetry: Within Functional Limits Lingual Strength: Within Functional Limits Lingual Sensation:  (NT) Velum: Within Functional Limits Mandible: Within Functional Limits   Ice Chips Ice chips: Not tested   Thin Liquid Thin Liquid: Within functional limits Presentation: Cup;Self Fed;Straw (7-8 trials)    Nectar Thick Nectar Thick Liquid: Not tested   Honey Thick Honey Thick Liquid: Not tested   Puree Puree: Within functional limits Presentation: Self Fed;Spoon (2 trials)   Solid   GO   Solid: Within functional limits (grossly) Presentation: Self Fed;Spoon (2 trials) Other Comments: min increased chewing time b/f clearing w/ bread trials       Orinda Kenner, MS, CCC-SLP  Jacobo Moncrief 06/12/2015,10:33 AM

## 2015-06-12 NOTE — Consult Note (Addendum)
Referring Physician: Tressia Miners    Chief Complaint: Right sided weakness  HPI: Anne Roy is an 55 y.o. [place] female who reports that she awakened on 6/5 and just did not feel right.  She could not appreciate that her right side was weak but noted that she could not walk right.  Although she was able to drive around town she did not feel comfortable going to [place] to pay bills.  Was noted to have facial droop by family and was brought to the ED.  Initial NIHSS of 2.  Date last known well: 06/10/2015 Time last known well: Time: 23:00 tPA Given: No: Outside time window  MRankin: 0  History reviewed. No pertinent past medical history.  Past Surgical History  Procedure Laterality Date  . Tubal ligation  1986    Family History  Problem Relation Age of Onset  . Diabetes Mother   . Hypertension Mother   . Diabetes Father   . Hypertension Father    Social History:  reports that she has quit smoking. She has never used smokeless tobacco. She reports that she does not drink alcohol or use illicit drugs.  Allergies: No Known Allergies  Medications:  I have reviewed the patient's current medications. Prior to Admission:  Prescriptions prior to admission  Medication Sig Dispense Refill Last Dose  . guaifenesin (ROBITUSSIN) 100 MG/5ML syrup Take 200 mg by mouth every 4 (four) hours as needed for cough or congestion.   06/11/2015 at 0800  . ibuprofen (ADVIL,MOTRIN) 200 MG tablet Take 400 mg by mouth every 6 (six) hours as needed for headache or mild pain.   Past Week at Unknown time   Scheduled: . amLODipine  5 mg Oral Daily  . aspirin  81 mg Oral Daily  . atorvastatin  40 mg Oral q1800  . enoxaparin (LOVENOX) injection  40 mg Subcutaneous Q24H  . hydrALAZINE  25 mg Oral Q8H  . multivitamin with minerals  1 tablet Oral Daily  . sodium chloride flush  3 mL Intravenous Q12H    ROS: History obtained from the patient  General ROS: negative for - chills, fatigue, fever, night sweats,  weight gain or weight loss Psychological ROS: negative for - behavioral disorder, hallucinations, memory difficulties, mood swings or suicidal ideation Ophthalmic ROS: negative for - blurry vision, double vision, eye pain or loss of vision ENT ROS: negative for - epistaxis, nasal discharge, oral lesions, sore throat, tinnitus or vertigo Allergy and Immunology ROS: negative for - hives or itchy/watery eyes Hematological and Lymphatic ROS: negative for - bleeding problems, bruising or swollen lymph nodes Endocrine ROS: negative for - galactorrhea, hair pattern changes, polydipsia/polyuria or temperature intolerance Respiratory ROS: negative for - cough, hemoptysis, shortness of breath or wheezing Cardiovascular ROS: negative for - chest pain, dyspnea on exertion, edema or irregular heartbeat Gastrointestinal ROS: negative for - abdominal pain, diarrhea, hematemesis, nausea/vomiting or stool incontinence Genito-Urinary ROS: negative for - dysuria, hematuria, incontinence or urinary frequency/urgency Musculoskeletal ROS: right leg pain for the past 2 years Neurological ROS: as noted in HPI Dermatological ROS: negative for rash and skin lesion changes  Physical Examination: Blood pressure 179/73, pulse 77, temperature 97.3 F (36.3 C), temperature source Oral, resp. rate 18, height 5\' 3"  (1.6 m), weight 54.658 kg (120 lb 8 oz), SpO2 100 %.  HEENT-  Normocephalic, no lesions, without obvious abnormality.  Normal external eye and conjunctiva.  Normal TM's bilaterally.  Normal auditory canals and external ears. Normal external nose, mucus membranes and septum.  Normal pharynx. Cardiovascular- S1, S2 normal, pulses palpable throughout   Lungs- chest clear, no wheezing, rales, normal symmetric air entry Abdomen- soft, non-tender; bowel sounds normal; no masses,  no organomegaly Extremities- no edema Lymph-no adenopathy palpable Musculoskeletal-no joint tenderness, deformity or swelling Skin-warm and  dry, no hyperpigmentation, vitiligo, or suspicious lesions  Neurological Examination Mental Status: Alert, oriented, thought content appropriate.  Speech fluent without evidence of aphasia.  Able to follow 3 step commands without difficulty.  Right neglect Cranial Nerves: II: Discs flat bilaterally; Visual fields grossly normal, pupils equal, round, reactive to light and accommodation III,IV, VI: ptosis not present, extra-ocular motions intact bilaterally V,VII: right facial droop, facial light touch sensation normal bilaterally VIII: hearing normal bilaterally IX,X: gag reflex present XI: bilateral shoulder shrug XII: midline tongue extension Motor: Right : Upper extremity   4+/5    Left:     Upper extremity   5/5  Lower extremity   5-/5      Lower extremity   5/5 Tone and bulk:normal tone throughout; no atrophy noted Sensory: Pinprick and light touch intact throughout, bilaterally Deep Tendon Reflexes: 2+ and symmetric throughout Plantars: Right: downgoing   Left: downgoing Cerebellar: Mild dysmetria with right finger to nose testing.  Dysmetria with BLE heel-to-shin testing Gait: not tested due to safety concerns   Laboratory Studies:  Basic Metabolic Panel:  Recent Labs Lab 06/11/15 1511 06/12/15 0416  NA 136 138  K 3.1* 3.6  CL 103 108  CO2 24 25  GLUCOSE 108* 107*  BUN 12 11  CREATININE 0.83 0.71  CALCIUM 9.0 8.5*    Liver Function Tests:  Recent Labs Lab 06/11/15 1511  AST 18  ALT 18  ALKPHOS 93  BILITOT 0.4  PROT 6.7  ALBUMIN 3.7   No results for input(s): LIPASE, AMYLASE in the last 168 hours. No results for input(s): AMMONIA in the last 168 hours.  CBC:  Recent Labs Lab 06/11/15 1511 06/12/15 0416  WBC 16.8* 14.3*  NEUTROABS 8.6*  --   HGB 13.3 12.7  HCT 41.4 38.4  MCV 80.0 78.4*  PLT 358 333    Cardiac Enzymes:  Recent Labs Lab 06/11/15 1511  TROPONINI <0.03    BNP: Invalid input(s): POCBNP  CBG: No results for input(s):  GLUCAP in the last 168 hours.  Microbiology: No results found for this or any previous visit.  Coagulation Studies:  Recent Labs  06/11/15 1511  LABPROT 13.5  INR 1.01    Urinalysis:  Recent Labs Lab 06/12/15 1115  COLORURINE STRAW*  LABSPEC 1.006  PHURINE 8.0  GLUCOSEU NEGATIVE  HGBUR 1+*  BILIRUBINUR NEGATIVE  KETONESUR NEGATIVE  PROTEINUR NEGATIVE  NITRITE NEGATIVE  LEUKOCYTESUR NEGATIVE    Lipid Panel:    Component Value Date/Time   CHOL 189 06/11/2015 1704   TRIG 168* 06/11/2015 1704   HDL 34* 06/11/2015 1704   CHOLHDL 5.6 06/11/2015 1704   VLDL 34 06/11/2015 1704   LDLCALC 121* 06/11/2015 1704    HgbA1C: No results found for: HGBA1C  Urine Drug Screen:  No results found for: LABOPIA, COCAINSCRNUR, LABBENZ, AMPHETMU, THCU, LABBARB  Alcohol Level: No results for input(s): ETH in the last 168 hours.  Other results: EKG: normal sinus rhythm at 91 bpm.  Imaging: Ct Head Wo Contrast  06/11/2015  CLINICAL DATA:  Slurred speech, off balance since yesterday. Right facial droop. EXAM: CT HEAD WITHOUT CONTRAST TECHNIQUE: Contiguous axial images were obtained from the base of the skull through the vertex without intravenous contrast.  COMPARISON:  None. FINDINGS: There is no evidence of mass effect, midline shift or extra-axial fluid collections. There is no evidence of a space-occupying lesion or intracranial hemorrhage. There is no evidence of a cortical-based area of acute infarction. There is an acute versus subacute left basal ganglia lacunar infarct. The ventricles and sulci are appropriate for the patient's age. The basal cisterns are patent. Visualized portions of the orbits are unremarkable. The visualized portions of the paranasal sinuses and mastoid air cells are unremarkable. The osseous structures are unremarkable. IMPRESSION: 1.  Acute versus subacute left basal ganglia lacunar infarct. Electronically Signed   By: Kathreen Devoid   On: 06/11/2015 16:09   Mr  Brain Wo Contrast  06/11/2015  CLINICAL DATA:  Acute presentation with slurred speech and facial droop. Intermittent headaches. Dizziness beginning yesterday. EXAM: MRI HEAD WITHOUT CONTRAST TECHNIQUE: Multiplanar, multiecho pulse sequences of the brain and surrounding structures were obtained without intravenous contrast. COMPARISON:  Head CT same day FINDINGS: Diffusion imaging shows acute infarction affecting the left basal ganglia/posterior limb internal capsule. There is an old left basal ganglia infarction anterior to that. The brainstem and cerebellum are normal. No abnormality affects the right cerebral hemisphere. No large vessel territory infarction. No mass lesion, hemorrhage, hydrocephalus or extra-axial collection. There is some hemosiderin deposition in the region of the old left basal ganglia infarction. No pituitary mass. No inflammatory sinus disease. No skull or skullbase lesion. IMPRESSION: Acute infarction affecting the left basal ganglia/posterior limb internal capsule. Adjacent old left basal ganglia infarction with some hemosiderin deposition. Electronically Signed   By: Nelson Chimes M.D.   On: 06/11/2015 18:25   US Carotid Bilateral  06/11/2015  CLINICAL DATA:  55 year old female with slurred speech and facial droop and difficulty ambulating. CVA. EXAM: BILATERAL CAROTID DUPLEX ULTRASOUND TECHNIQUE: Pearline Cables scale imaging, color Doppler and duplex ultrasound were performed of bilateral carotid and vertebral arteries in the neck. COMPARISON:  None. FINDINGS: Criteria: Quantification of carotid stenosis is based on velocity parameters that correlate the residual internal carotid diameter with NASCET-based stenosis levels, using the diameter of the distal internal carotid lumen as the denominator for stenosis measurement. The following velocity measurements were obtained: RIGHT ICA:  Peak systolic velocity 98 Cm/sec CCA:  Peak systolic velocity of 93 cm/sec SYSTOLIC ICA/CCA RATIO:  1.1 DIASTOLIC  ICA/CCA RATIO:  1.4 ECA:  Peak systolic velocity Q000111Q cm/sec LEFT ICA:  Peak systolic velocity of 98 cm/sec CCA:  Peak systolic velocity 89 cm/sec SYSTOLIC ICA/CCA RATIO:  1.1 DIASTOLIC ICA/CCA RATIO:  1.5 ECA:  Peak systolic velocity of Q000111Q cm/sec RIGHT CAROTID ARTERY: There is mild amount of plaque at the carotid bulb and proximal aspect of the right ICA. RIGHT VERTEBRAL ARTERY:  Antegrade LEFT CAROTID ARTERY:  Minimal plaque noted in the proximal left ICA. LEFT VERTEBRAL ARTERY:  Antegrade IMPRESSION: Less than 50% ICA stenosis bilaterally. Electronically Signed   By: Anner Crete M.D.   On: 06/11/2015 19:39    Assessment: 54 y.o. female presenting with right sided weakness/neglect.  MRI of the brain personally reviewed and shows an acute left basal ganglia infarct.  Carotid dopplers show no evidence of hemodynamically significant stenosis.  Echocardiogram pending.  Patient on no antiplatelet therapy at home.     Stroke Risk Factors - none  Plan: 1. HgbA1c, fasting lipid panel 2. PT consult, OT consult, Speech consult 3. Echocardiogram pending 4. Prophylactic therapy-Antiplatelet med: Aspirin - dose 325mg  daily 5. NPO until RN stroke swallow screen 6. Telemetry monitoring 7. Frequent  neuro checks    Alexis Goodell, MD Neurology [phone] 06/12/2015, 11:45 AM

## 2015-06-12 NOTE — Evaluation (Signed)
Physical Therapy Evaluation Patient Details Name: Anne Roy MRN: [id number] DOB: Feb 02, 1960 Today's Date: 06/12/2015   History of Present Illness  Pt admitted for positive L CVA in basal ganglia. Pt with complaints of balance/slurred speech. Pt with recent gastroenteritis.   Clinical Impression  Pt is a pleasant 55 year old female who was admitted for L CVA. Pt performs bed mobility with independence and  Transfers/ambulation with min assist and no AD. Pt performed further ambulation with RW with cga with improved balance. Pt needs RW for stability and to decrease fall risk. Pt motivated to perform therapy. Pt demonstrates deficits with strength on R UE/LE/balance/endurance. Would benefit from skilled PT to address above deficits and promote optimal return to PLOF. Recommend transition to [place] upon discharge from acute hospitalization.       Follow Up Recommendations Home health PT;Supervision for mobility/OOB    Equipment Recommendations  Rolling walker with 5" wheels    Recommendations for Other Services       Precautions / Restrictions Precautions Precautions: Fall Restrictions Weight Bearing Restrictions: No      Mobility  Bed Mobility Overal bed mobility: Independent             General bed mobility comments: pt very impulsive and tries to get out of bed prior to therapist instruction. Once seated at EOB, pt able to sit with safe techniqu  Transfers Overall transfer level: Needs assistance Equipment used: None Transfers: Sit to/from Stand Sit to Stand: Min assist         General transfer comment: transfers performed with rw with cues for pushing from seated surface. Once standing, pt demonstrates slight post leaning, able to self correct with cues  Ambulation/Gait Ambulation/Gait assistance: Min assist Ambulation Distance (Feet): 50 Feet Assistive device: 1 person hand held assist Gait Pattern/deviations: Step-through pattern     General Gait Details:  ambulated using slow gait speed with reciprocal gait pattern. Increased lateral sway noted during ambulation, however no formal LOB noted. Further ambulation performed in exercises  Stairs            Wheelchair Mobility    Modified Rankin (Stroke Patients Only)       Balance Overall balance assessment: Needs assistance Sitting-balance support: Feet supported Sitting balance-Leahy Scale: Normal     Standing balance support: Single extremity supported Standing balance-Leahy Scale: Fair                               Pertinent Vitals/Pain Pain Assessment: No/denies pain    Home Living Family/patient expects to be discharged to:: Private residence Living Arrangements: Spouse/significant other Available Help at Discharge: Family Type of Home: House Home Access: Level entry     Home Layout: One level Home Equipment: None      Prior Function Level of Independence: Independent               Hand Dominance        Extremity/Trunk Assessment   Upper Extremity Assessment: Generalized weakness (R UE grossly 4/5; L UE grossly 5/5)           Lower Extremity Assessment: Generalized weakness (L LE grossly 4+/5; R LE grossly 5/5)         Communication   Communication:  (slightly slurred speech secondary to facial droop)  Cognition Arousal/Alertness: Awake/alert Behavior During Therapy: WFL for tasks assessed/performed;Impulsive Overall Cognitive Status: Within Functional Limits for tasks assessed  General Comments      Exercises Other Exercises Other Exercises: Pt ambulated additional 15' using rw to bathroom with cga. Improved balance noted with AD. Pt able to perform self hygiene with supervision and safe technique performed.      Assessment/Plan    PT Assessment Patient needs continued PT services  PT Diagnosis Difficulty walking;Generalized weakness   PT Problem List Decreased strength;Decreased  balance;Decreased mobility;Decreased knowledge of use of DME;Decreased safety awareness  PT Treatment Interventions DME instruction;Gait training;Therapeutic exercise;Balance training   PT Goals (Current goals can be found in the Care Plan section) Acute Rehab PT Goals Patient Stated Goal: to get back her strength PT Goal Formulation: With patient Time For Goal Achievement: 06/26/15 Potential to Achieve Goals: Good    Frequency 7X/week   Barriers to discharge        Co-evaluation               End of Session Equipment Utilized During Treatment: Gait belt Activity Tolerance: Patient tolerated treatment well Patient left:  (on EOB with OT entering room) Nurse Communication: Mobility status         Time: 1410-1425 PT Time Calculation (min) (ACUTE ONLY): 15 min   Charges:   PT Evaluation $PT Eval Moderate Complexity: 1 Procedure PT Treatments $Therapeutic Activity: 8-22 mins   PT G Codes:        Kayton Dunaj 2015/07/09, 4:55 PM  Greggory Stallion, PT, DPT [phone]

## 2015-06-12 NOTE — Progress Notes (Signed)
*  PRELIMINARY RESULTS* Echocardiogram 2D Echocardiogram has been performed.  Anne Roy 06/12/2015, 12:30 PM

## 2015-06-12 NOTE — Progress Notes (Signed)
[name]    MR#:  [id number]  DATE OF BIRTH:  10-03-1960  SUBJECTIVE:  CHIEF COMPLAINT:   Chief Complaint  Patient presents with  . Facial Droop  . Gait Problem   - feels better today, still has right facial droop and slurred speech - awaiting PT, OT consults. MRI with acute infarct. ECHO pending  REVIEW OF SYSTEMS:  Review of Systems  Constitutional: Negative for fever, chills and malaise/fatigue.  HENT: Negative for ear discharge, ear pain and nosebleeds.   Eyes: Negative for blurred vision and double vision.  Respiratory: Negative for cough, shortness of breath and wheezing.   Cardiovascular: Negative for chest pain and palpitations.  Gastrointestinal: Negative for nausea, vomiting, abdominal pain, diarrhea and constipation.  Genitourinary: Negative for dysuria and urgency.  Musculoskeletal: Negative for myalgias, back pain and neck pain.  Neurological: Positive for speech change. Negative for dizziness, seizures and headaches.  Psychiatric/Behavioral: Negative for depression.    DRUG ALLERGIES:  No Known Allergies  VITALS:  Blood pressure 179/73, pulse 77, temperature 97.3 F (36.3 C), temperature source Oral, resp. rate 18, height 5\' 3"  (1.6 m), weight 54.658 kg (120 lb 8 oz), SpO2 100 %.  PHYSICAL EXAMINATION:  Physical Exam  GENERAL: 55 y.o.-year-old patient lying in the bed with no acute distress.  EYES: Pupils equal, round, reactive to light and accommodation. No scleral icterus. Extraocular muscles intact. Right facial droop noted. HEENT: Head atraumatic, normocephalic. Oropharynx and nasopharynx clear.  NECK: Supple, no jugular venous distention. No thyroid enlargement, no tenderness.  LUNGS: Normal breath sounds bilaterally, no wheezing, rales,rhonchi or crepitation. No use of accessory muscles of respiration.  CARDIOVASCULAR: S1, S2 normal. No murmurs, rubs, or gallops.   ABDOMEN: Soft, nontender, nondistended. Bowel sounds present. No organomegaly or mass.  EXTREMITIES: No pedal edema, cyanosis, or clubbing. Homans sign negative. NEUROLOGIC: Cranial nerves II through XII are intact. Right facial droop noted. Muscle strength 5/5 in all extremities. Sensation intact. Gait not checked.  PSYCHIATRIC: The patient is alert and oriented x 3.  SKIN: No obvious rash, lesion, or ulcer.    LABORATORY PANEL:   CBC  Recent Labs Lab 06/12/15 0416  WBC 14.3*  HGB 12.7  HCT 38.4  PLT 333   ------------------------------------------------------------------------------------------------------------------  Chemistries   Recent Labs Lab 06/11/15 1511 06/12/15 0416  NA 136 138  K 3.1* 3.6  CL 103 108  CO2 24 25  GLUCOSE 108* 107*  BUN 12 11  CREATININE 0.83 0.71  CALCIUM 9.0 8.5*  AST 18  --   ALT 18  --   ALKPHOS 93  --   BILITOT 0.4  --    ------------------------------------------------------------------------------------------------------------------  Cardiac Enzymes  Recent Labs Lab 06/11/15 1511  TROPONINI <0.03   ------------------------------------------------------------------------------------------------------------------  RADIOLOGY:  Ct Head Wo Contrast  06/11/2015  CLINICAL DATA:  Slurred speech, off balance since yesterday. Right facial droop. EXAM: CT HEAD WITHOUT CONTRAST TECHNIQUE: Contiguous axial images were obtained from the base of the skull through the vertex without intravenous contrast. COMPARISON:  None. FINDINGS: There is no evidence of mass effect, midline shift or extra-axial fluid collections. There is no evidence of a space-occupying lesion or intracranial hemorrhage. There is no evidence of a cortical-based area of acute infarction. There is an acute versus subacute left basal ganglia lacunar infarct. The ventricles and sulci are appropriate for the patient's age. The basal cisterns are patent. Visualized portions  of the orbits are unremarkable.  The visualized portions of the paranasal sinuses and mastoid air cells are unremarkable. The osseous structures are unremarkable. IMPRESSION: 1.  Acute versus subacute left basal ganglia lacunar infarct. Electronically Signed   By: Kathreen Devoid   On: 06/11/2015 16:09   Mr Brain Wo Contrast  06/11/2015  CLINICAL DATA:  Acute presentation with slurred speech and facial droop. Intermittent headaches. Dizziness beginning yesterday. EXAM: MRI HEAD WITHOUT CONTRAST TECHNIQUE: Multiplanar, multiecho pulse sequences of the brain and surrounding structures were obtained without intravenous contrast. COMPARISON:  Head CT same day FINDINGS: Diffusion imaging shows acute infarction affecting the left basal ganglia/posterior limb internal capsule. There is an old left basal ganglia infarction anterior to that. The brainstem and cerebellum are normal. No abnormality affects the right cerebral hemisphere. No large vessel territory infarction. No mass lesion, hemorrhage, hydrocephalus or extra-axial collection. There is some hemosiderin deposition in the region of the old left basal ganglia infarction. No pituitary mass. No inflammatory sinus disease. No skull or skullbase lesion. IMPRESSION: Acute infarction affecting the left basal ganglia/posterior limb internal capsule. Adjacent old left basal ganglia infarction with some hemosiderin deposition. Electronically Signed   By: Nelson Chimes M.D.   On: 06/11/2015 18:25   US Carotid Bilateral  06/11/2015  CLINICAL DATA:  55 year old female with slurred speech and facial droop and difficulty ambulating. CVA. EXAM: BILATERAL CAROTID DUPLEX ULTRASOUND TECHNIQUE: Pearline Cables scale imaging, color Doppler and duplex ultrasound were performed of bilateral carotid and vertebral arteries in the neck. COMPARISON:  None. FINDINGS: Criteria: Quantification of carotid stenosis is based on velocity parameters that correlate the residual internal carotid diameter with  NASCET-based stenosis levels, using the diameter of the distal internal carotid lumen as the denominator for stenosis measurement. The following velocity measurements were obtained: RIGHT ICA:  Peak systolic velocity 98 Cm/sec CCA:  Peak systolic velocity of 93 cm/sec SYSTOLIC ICA/CCA RATIO:  1.1 DIASTOLIC ICA/CCA RATIO:  1.4 ECA:  Peak systolic velocity Q000111Q cm/sec LEFT ICA:  Peak systolic velocity of 98 cm/sec CCA:  Peak systolic velocity 89 cm/sec SYSTOLIC ICA/CCA RATIO:  1.1 DIASTOLIC ICA/CCA RATIO:  1.5 ECA:  Peak systolic velocity of Q000111Q cm/sec RIGHT CAROTID ARTERY: There is mild amount of plaque at the carotid bulb and proximal aspect of the right ICA. RIGHT VERTEBRAL ARTERY:  Antegrade LEFT CAROTID ARTERY:  Minimal plaque noted in the proximal left ICA. LEFT VERTEBRAL ARTERY:  Antegrade IMPRESSION: Less than 50% ICA stenosis bilaterally. Electronically Signed   By: Anner Crete M.D.   On: 06/11/2015 19:39    EKG:   Orders placed or performed during the hospital encounter of 06/11/15  . ED EKG  . ED EKG    ASSESSMENT AND PLAN:   Anne Roy is a 55 y.o. female with no significant PMH Comes to the emergency room secondary to balance problems and also slurred speech noted last night.  #1 acute CVA- MRI with left basal ganglion/posterior limb internal capsule infarct. - Patient with right facial droop and slurred speech. -carotid Dopplers with no significant stenosis and pending 2-D echocardiogram. -Neuro checks stable. Neurology consult. -Physical therapy, occupational therapy and speech therapy consults. -on aspirin and statin. Lipid panel LDL 121.  #2 hypokalemia-replaced  #3 leukocytosis-could be stress-induced, UA normal, no indication for ABX  #4 HTN- started norvasc and oral hydralazine. PRN IV hydralazine added as well  #5 DVT prophylaxis-on Lovenox     All the records are reviewed and case discussed with Care Management/Social Workerr. Management plans discussed with  the patient,  family and they are in agreement.  CODE STATUS: Full Code  TOTAL TIME TAKING CARE OF THIS PATIENT: 37 minutes.   POSSIBLE D/C IN 1 DAY, DEPENDING ON CLINICAL CONDITION.   Gladstone Lighter M.D on 06/12/2015 at 11:55 AM  Between 7am to 6pm - Pager - [phone]  After 6pm go to [email or web address] - password [email or web address]  [facility]  Office  [phone]  CC: Primary care physician; No primary care provider on file.

## 2015-06-13 MED ORDER — AMLODIPINE BESYLATE 10 MG PO TABS
10.0000 mg | ORAL_TABLET | Freq: Every day | ORAL | Status: DC
  Administered 2015-06-13: 10 mg via ORAL
  Filled 2015-06-13: qty 1

## 2015-06-13 MED ORDER — LORATADINE 10 MG PO TABS
10.0000 mg | ORAL_TABLET | Freq: Every day | ORAL | Status: DC
  Administered 2015-06-13: 10 mg via ORAL
  Filled 2015-06-13: qty 1

## 2015-06-13 MED ORDER — GUAIFENESIN-DM 100-10 MG/5ML PO SYRP
10.0000 mL | ORAL_SOLUTION | Freq: Four times a day (QID) | ORAL | Status: DC
  Administered 2015-06-13: 10 mL via ORAL
  Filled 2015-06-13: qty 10

## 2015-06-13 MED ORDER — HYDRALAZINE HCL 25 MG PO TABS: 25.0000 mg | ORAL_TABLET | Freq: Three times a day (TID) | ORAL | Status: DC

## 2015-06-13 MED ORDER — AMLODIPINE BESYLATE 10 MG PO TABS: 10.0000 mg | ORAL_TABLET | Freq: Every day | ORAL | Status: DC

## 2015-06-13 MED ORDER — ASPIRIN EC 325 MG PO TBEC: 325.0000 mg | DELAYED_RELEASE_TABLET | Freq: Every day | ORAL | Status: DC

## 2015-06-13 MED ORDER — HYDRALAZINE HCL 25 MG PO TABS: 25.0000 mg | ORAL_TABLET | Freq: Three times a day (TID) | ORAL | Status: AC

## 2015-06-13 MED ORDER — ATORVASTATIN CALCIUM 40 MG PO TABS: 40.0000 mg | ORAL_TABLET | Freq: Every day | ORAL | Status: DC

## 2015-06-13 MED ORDER — ASPIRIN EC 325 MG PO TBEC: 325.0000 mg | DELAYED_RELEASE_TABLET | Freq: Every day | ORAL | Status: AC

## 2015-06-13 MED ORDER — AMLODIPINE BESYLATE 10 MG PO TABS: 10.0000 mg | ORAL_TABLET | Freq: Every day | ORAL | Status: AC

## 2015-06-13 MED ORDER — ATORVASTATIN CALCIUM 40 MG PO TABS: 40.0000 mg | ORAL_TABLET | Freq: Every day | ORAL | Status: AC

## 2015-06-13 NOTE — Discharge Summary (Signed)
[facility] [facility] NAME: Anne Roy    MR#:  [id number]  DATE OF BIRTH:  08/13/1960  DATE OF ADMISSION:  06/11/2015 ADMITTING PHYSICIAN: Gladstone Lighter, MD  DATE OF DISCHARGE: 06/13/2015 11:25 AM  PRIMARY CARE PHYSICIAN: No primary care provider on file.    ADMISSION DIAGNOSIS:  CVA (cerebral infarction) [I63.9]  DISCHARGE DIAGNOSIS:  Active Problems:   CVA (cerebral infarction)   SECONDARY DIAGNOSIS:  History reviewed. No pertinent past medical history.  HOSPITAL COURSE:   Anne Roy is a 55 y.o. female with no significant PMH Comes to the emergency room secondary to balance problems and also slurred speech noted last night.  #1 acute CVA- MRI with left basal ganglion/posterior limb internal capsule infarct. - Patient with right facial droop and slurred speech. -carotid Dopplers with no significant stenosis and normal 2-D echocardiogram. -Neuro checks stable. Appreciate Neurology consult. -Physical therapy, occupational therapy and speech therapy consults done. Recommended home health. -Patient couldn't pay out of pocket, so refused home health. Need to apply for Medicaid. Care manager helped the patient regarding the same -on aspirin and statin. Lipid panel LDL 121.  #2 hypokalemia-replaced  #3 leukocytosis-could be stress-induced, UA normal, no indication for ABX Improved  #4 HTN- on Norvasc and oral hydralazine  Stable and being discharged home.  DISCHARGE CONDITIONS:   Stable  CONSULTS OBTAINED:  Treatment Team:  Catarina Hartshorn, MD Alexis Goodell, MD  DRUG ALLERGIES:  No Known Allergies  DISCHARGE MEDICATIONS:   Discharge Medication List as of 06/13/2015 10:47 AM    CONTINUE these medications which have CHANGED   Details  amLODipine (NORVASC) 10 MG tablet Take 1 tablet (10 mg total) by mouth daily., Starting 06/13/2015, Until Discontinued, Print    aspirin EC 325 MG tablet Take 1 tablet (325 mg  total) by mouth daily., Starting 06/13/2015, Until Discontinued, Print    atorvastatin (LIPITOR) 40 MG tablet Take 1 tablet (40 mg total) by mouth daily at 6 PM., Starting 06/13/2015, Until Discontinued, Print    hydrALAZINE (APRESOLINE) 25 MG tablet Take 1 tablet (25 mg total) by mouth every 8 (eight) hours., Starting 06/13/2015, Until Discontinued, Print      CONTINUE these medications which have NOT CHANGED   Details  guaifenesin (ROBITUSSIN) 100 MG/5ML syrup Take 200 mg by mouth every 4 (four) hours as needed for cough or congestion., Until Discontinued, Historical Med    ibuprofen (ADVIL,MOTRIN) 200 MG tablet Take 400 mg by mouth every 6 (six) hours as needed for headache or mild pain., Until Discontinued, Historical Med      STOP taking these medications     Multiple Vitamins-Minerals (MULTIVITAMIN PO)          DISCHARGE INSTRUCTIONS:   1. PCP follow-up in 1-2 weeks   If you experience worsening of your admission symptoms, develop shortness of breath, life threatening emergency, suicidal or homicidal thoughts you must seek medical attention immediately by calling 911 or calling your MD immediately  if symptoms less severe.  You Must read complete instructions/literature along with all the possible adverse reactions/side effects for all the Medicines you take and that have been prescribed to you. Take any new Medicines after you have completely understood and accept all the possible adverse reactions/side effects.   Please note  You were cared for by a hospitalist during your hospital stay. If you have any questions about your discharge medications or the care you received while you were in the hospital after you  are discharged, you can call the unit and asked to speak with the hospitalist on call if the hospitalist that took care of you is not available. Once you are discharged, your primary care physician will handle any further medical issues. Please note that NO REFILLS for any  discharge medications will be authorized once you are discharged, as it is imperative that you return to your primary care physician (or establish a relationship with a primary care physician if you do not have one) for your aftercare needs so that they can reassess your need for medications and monitor your lab values.    Today   CHIEF COMPLAINT:   Chief Complaint  Patient presents with  . Facial Droop  . Gait Problem    VITAL SIGNS:  Blood pressure 160/74, pulse 82, temperature 97.6 F (36.4 C), temperature source Axillary, resp. rate 16, height 5\' 3"  (1.6 m), weight 54.658 kg (120 lb 8 oz), SpO2 100 %.  I/O:   Intake/Output Summary (Last 24 hours) at 06/13/15 1517 Last data filed at 06/13/15 0438  Gross per 24 hour  Intake 1686.25 ml  Output   1900 ml  Net -213.75 ml    PHYSICAL EXAMINATION:   Physical Exam  GENERAL: 55 y.o.-year-old patient lying in the bed with no acute distress.  EYES: Pupils equal, round, reactive to light and accommodation. No scleral icterus. Extraocular muscles intact. Right facial droop noted. HEENT: Head atraumatic, normocephalic. Oropharynx and nasopharynx clear.  NECK: Supple, no jugular venous distention. No thyroid enlargement, no tenderness.  LUNGS: Normal breath sounds bilaterally, no wheezing, rales,rhonchi or crepitation. No use of accessory muscles of respiration.  CARDIOVASCULAR: S1, S2 normal. No murmurs, rubs, or gallops.  ABDOMEN: Soft, nontender, nondistended. Bowel sounds present. No organomegaly or mass.  EXTREMITIES: No pedal edema, cyanosis, or clubbing. Homans sign negative. NEUROLOGIC: Cranial nerves II through XII are intact. Right facial droop noted. Muscle strength 5/5 in all extremities. Sensation intact. Gait not checked.  PSYCHIATRIC: The patient is alert and oriented x 3.  SKIN: No obvious rash, lesion, or ulcer.   DATA REVIEW:   CBC  Recent Labs Lab 06/12/15 0416  WBC 14.3*  HGB 12.7  HCT 38.4   PLT 333    Chemistries   Recent Labs Lab 06/11/15 1511 06/12/15 0416  NA 136 138  K 3.1* 3.6  CL 103 108  CO2 24 25  GLUCOSE 108* 107*  BUN 12 11  CREATININE 0.83 0.71  CALCIUM 9.0 8.5*  AST 18  --   ALT 18  --   ALKPHOS 93  --   BILITOT 0.4  --     Cardiac Enzymes  Recent Labs Lab 06/11/15 1511  TROPONINI <0.03    Microbiology Results  No results found for this or any previous visit.  RADIOLOGY:  Ct Head Wo Contrast  06/11/2015  CLINICAL DATA:  Slurred speech, off balance since yesterday. Right facial droop. EXAM: CT HEAD WITHOUT CONTRAST TECHNIQUE: Contiguous axial images were obtained from the base of the skull through the vertex without intravenous contrast. COMPARISON:  None. FINDINGS: There is no evidence of mass effect, midline shift or extra-axial fluid collections. There is no evidence of a space-occupying lesion or intracranial hemorrhage. There is no evidence of a cortical-based area of acute infarction. There is an acute versus subacute left basal ganglia lacunar infarct. The ventricles and sulci are appropriate for the patient's age. The basal cisterns are patent. Visualized portions of the orbits are unremarkable. The visualized portions of  the paranasal sinuses and mastoid air cells are unremarkable. The osseous structures are unremarkable. IMPRESSION: 1.  Acute versus subacute left basal ganglia lacunar infarct. Electronically Signed   By: Kathreen Devoid   On: 06/11/2015 16:09   Mr Brain Wo Contrast  06/11/2015  CLINICAL DATA:  Acute presentation with slurred speech and facial droop. Intermittent headaches. Dizziness beginning yesterday. EXAM: MRI HEAD WITHOUT CONTRAST TECHNIQUE: Multiplanar, multiecho pulse sequences of the brain and surrounding structures were obtained without intravenous contrast. COMPARISON:  Head CT same day FINDINGS: Diffusion imaging shows acute infarction affecting the left basal ganglia/posterior limb internal capsule. There is an old  left basal ganglia infarction anterior to that. The brainstem and cerebellum are normal. No abnormality affects the right cerebral hemisphere. No large vessel territory infarction. No mass lesion, hemorrhage, hydrocephalus or extra-axial collection. There is some hemosiderin deposition in the region of the old left basal ganglia infarction. No pituitary mass. No inflammatory sinus disease. No skull or skullbase lesion. IMPRESSION: Acute infarction affecting the left basal ganglia/posterior limb internal capsule. Adjacent old left basal ganglia infarction with some hemosiderin deposition. Electronically Signed   By: Nelson Chimes M.D.   On: 06/11/2015 18:25   US Carotid Bilateral  06/11/2015  CLINICAL DATA:  55 year old female with slurred speech and facial droop and difficulty ambulating. CVA. EXAM: BILATERAL CAROTID DUPLEX ULTRASOUND TECHNIQUE: Pearline Cables scale imaging, color Doppler and duplex ultrasound were performed of bilateral carotid and vertebral arteries in the neck. COMPARISON:  None. FINDINGS: Criteria: Quantification of carotid stenosis is based on velocity parameters that correlate the residual internal carotid diameter with NASCET-based stenosis levels, using the diameter of the distal internal carotid lumen as the denominator for stenosis measurement. The following velocity measurements were obtained: RIGHT ICA:  Peak systolic velocity 98 Cm/sec CCA:  Peak systolic velocity of 93 cm/sec SYSTOLIC ICA/CCA RATIO:  1.1 DIASTOLIC ICA/CCA RATIO:  1.4 ECA:  Peak systolic velocity Q000111Q cm/sec LEFT ICA:  Peak systolic velocity of 98 cm/sec CCA:  Peak systolic velocity 89 cm/sec SYSTOLIC ICA/CCA RATIO:  1.1 DIASTOLIC ICA/CCA RATIO:  1.5 ECA:  Peak systolic velocity of Q000111Q cm/sec RIGHT CAROTID ARTERY: There is mild amount of plaque at the carotid bulb and proximal aspect of the right ICA. RIGHT VERTEBRAL ARTERY:  Antegrade LEFT CAROTID ARTERY:  Minimal plaque noted in the proximal left ICA. LEFT VERTEBRAL ARTERY:   Antegrade IMPRESSION: Less than 50% ICA stenosis bilaterally. Electronically Signed   By: Anner Crete M.D.   On: 06/11/2015 19:39    EKG:   Orders placed or performed during the hospital encounter of 06/11/15  . ED EKG  . ED EKG      Management plans discussed with the patient, family and they are in agreement.  CODE STATUS:  Code Status History    Date Active Date Inactive Code Status Order ID Comments User Context   06/11/2015  9:27 PM 06/13/2015  2:32 PM Full Code [id number]  Gladstone Lighter, MD Inpatient      TOTAL TIME TAKING CARE OF THIS PATIENT: 37 minutes.    Gladstone Lighter M.D on 06/13/2015 at 3:17 PM  Between 7am to 6pm - Pager - [phone]  After 6pm go to [email or web address] - password [email or web address] [facility]  [facility]  Office  [phone]  CC: Primary care physician; No primary care provider on file.

## 2015-06-13 NOTE — Plan of Care (Signed)
Problem: Food- and Nutrition-Related Knowledge Deficit (NB-1.1) Goal: Nutrition education Formal process to instruct or train a patient/client in a skill or to impart knowledge to help patients/clients voluntarily manage or modify food choices and eating behavior to maintain or improve health.  Outcome: Completed/Met Date Met:  06/13/15 Nutrition Education Note  RD consulted for nutrition education regarding a Heart Healthy diet.   Lipid Panel     Component Value Date/Time    CHOL 189 06/11/2015 1704    TRIG 168* 06/11/2015 1704    HDL 34* 06/11/2015 1704    CHOLHDL 5.6 06/11/2015 1704    VLDL 34 06/11/2015 1704    LDLCALC 121* 06/11/2015 1704    RD provided "Stroke Nutrition Therapy" handout from the Academy of Nutrition and Dietetics. Reviewed patient's dietary recall. Provided examples on ways to decrease sodium and fat intake in diet. Discouraged intake of processed foods and use of salt shaker. Encouraged fresh fruits and vegetables as well as whole grain sources of carbohydrates to maximize fiber intake. Teach back method used.  Expect good compliance.  Pt being discharged. No further needs identified.  Kerman Passey [place], [place], LDN [phone] Pager  [phone] Weekend/On-Call Pager

## 2015-06-13 NOTE — Progress Notes (Signed)
Occupational Therapy Treatment Patient Details Name: Anne Roy MRN: [id number] DOB: April 01, 1960 Today's Date: 06/13/2015    History of present illness Pt admitted for positive L CVA in basal ganglia. Pt with complaints of balance/slurred speech. Pt with recent gastroenteritis.    OT comments  Pt seen with son in room for education in HEP for RUE strengthening and fine motor coordination skills.  Encouraged her to use her RUE as much as possible for ADLs and rec using a bag on her FWW and keep cell phone with her at all times.  Also rec removing all throw rugs and use of a shower chair to prevent falls and have a family member with her at all times.  Pt and son educated in all rec tech for ADLs and HEP.  Pt to go home today and decline [facility] for PT.    Follow Up Recommendations       Equipment Recommendations       Recommendations for Other Services      Precautions / Restrictions Precautions Precautions: Fall Restrictions Weight Bearing Restrictions: No       Mobility Bed Mobility               General bed mobility comments: not performed as pt received up in chair about to discharge  Transfers Overall transfer level: Needs assistance Equipment used: Rolling walker (2 wheeled) Transfers: Sit to/from Stand Sit to Stand: Min guard         General transfer comment: transfers performed with rw and safe technique. Pt with steady balance once standing.    Balance           Standing balance support: Bilateral upper extremity supported Standing balance-Leahy Scale: Fair                     ADL Overall ADL's : Needs assistance/impaired                                       General ADL Comments: Discussed safe use of FWW at home for ADLs to prevent falls.  Rec using a bag to carry items including phone in case of emergency.  Rec a family member be present when showering and use a chair in shower to increase safety and also prevent  falls.      Vision                     Perception     Praxis      Cognition   Behavior During Therapy: [facility] : [facility] for tasks assessed/performed;Impulsive Overall Cognitive Status: Within Functional Limits for tasks assessed                       Extremity/Trunk Assessment               Exercises Other Exercises Other Exercises: Seated ther-ex performed x 10 reps including B LE LAQ, alternating marching and R UE shoulder flexion. Pt then performed R shoulder flexion with alt LAQ with L LE. Pt requires supervision for all ther-ex with cues for correct technique. Other Exercises: Pt and son educated in HEP for R hand for strengthening and fine motor skills and handout given as well.     Shoulder Instructions       General Comments      Pertinent Vitals/ Pain  Pain Assessment: No/denies pain  Home Living                                          Prior Functioning/Environment              Frequency       Progress Toward Goals  OT Goals(current goals can now be found in the care plan section)  Progress towards OT goals: Progressing toward goals  Acute Rehab OT Goals Patient Stated Goal: to go home OT Goal Formulation: With patient/family Potential to Achieve Goals: Good  Plan      Co-evaluation                 End of Session     Activity Tolerance     Patient Left     Nurse Communication          Time: [id number] OT Time Calculation (min): 30 min  Charges: OT General Charges $OT Visit: 1 Procedure OT Treatments $Self Care/Home Management : 8-22 mins $Neuromuscular Re-education: 8-22 mins   Chrys Racer, OTR/L ascom [phone] 06/13/2015, 11:22 AM

## 2015-06-13 NOTE — Progress Notes (Signed)
A&O. Admitted with positive left sided stroke, right-sided weakness. Facial drooping noted. Up with minimal standby assist.

## 2015-06-13 NOTE — Progress Notes (Signed)
Physical Therapy Treatment Patient Details Name: Anne Roy MRN: [id number] DOB: 1960/05/01 Today's Date: 06/13/2015    History of Present Illness Pt admitted for positive L CVA in basal ganglia. Pt with complaints of balance/slurred speech. Pt with recent gastroenteritis.     PT Comments    Pt is making good progress towards goals with increased distance with ambulation. RW used with improvement in stability. Pt still with unsteady gait with cues for correction; recommend continued use of rw. Pt motivated to perform therapy and is currently ready for discharge from hospital. Reviewed hep this date.  Follow Up Recommendations  Home health PT;Supervision for mobility/OOB     Equipment Recommendations  Rolling walker with 5" wheels    Recommendations for Other Services       Precautions / Restrictions Precautions Precautions: Fall Restrictions Weight Bearing Restrictions: No    Mobility  Bed Mobility               General bed mobility comments: not performed as pt received up in chair about to discharge  Transfers Overall transfer level: Needs assistance Equipment used: Rolling walker (2 wheeled) Transfers: Sit to/from Stand Sit to Stand: Min guard         General transfer comment: transfers performed with rw and safe technique. Pt with steady balance once standing.  Ambulation/Gait Ambulation/Gait assistance: Min guard Ambulation Distance (Feet): 220 Feet Assistive device: Rolling walker (2 wheeled) Gait Pattern/deviations: Step-through pattern     General Gait Details: ambulated using rw and reciprocal gait pattern. Pt with small step length and slightly ataxic gait with crossing over midline. Pt cued for upright posture during ambulation. Pt able to follow commands for fluid gait pattern, however not consistently. Pt has difficulty with obstacle avoidance and veers R with mobility.   Stairs            Wheelchair Mobility    Modified Rankin  (Stroke Patients Only)       Balance           Standing balance support: Bilateral upper extremity supported Standing balance-Leahy Scale: Fair                      Cognition Arousal/Alertness: Awake/alert Behavior During Therapy: WFL for tasks assessed/performed;Impulsive Overall Cognitive Status: Within Functional Limits for tasks assessed                      Exercises Other Exercises Other Exercises: Seated ther-ex performed x 10 reps including B LE LAQ, alternating marching and R UE shoulder flexion. Pt then performed R shoulder flexion with alt LAQ with L LE. Pt requires supervision for all ther-ex with cues for correct technique.    General Comments        Pertinent Vitals/Pain Pain Assessment: No/denies pain    Home Living                      Prior Function            PT Goals (current goals can now be found in the care plan section) Acute Rehab PT Goals Patient Stated Goal: to go home PT Goal Formulation: With patient Time For Goal Achievement: 06/26/15 Potential to Achieve Goals: Good Progress towards PT goals: Progressing toward goals    Frequency  7X/week    PT Plan Current plan remains appropriate    Co-evaluation             End of  Session Equipment Utilized During Treatment: Gait belt Activity Tolerance: Patient tolerated treatment well Patient left: in chair;with family/visitor present     Time: 1016-1028 PT Time Calculation (min) (ACUTE ONLY): 12 min  Charges:  $Gait Training: 8-22 mins                    G Codes:      Annita Ratliff 07-04-15, 11:11 AM  Greggory Stallion, PT, DPT [phone]

## 2015-06-13 NOTE — Care Management (Addendum)
Patient has declined referral for home health physical therapy and to have pcp follow up appointment scheduled. She will schedule an appointment herself in [employer]  Provided contact information for the [facility] for follow up.  Faxed [facility] discharge [facility] and instructed to proceed across the street to pick up meds at discharge.  Provided patient a rolling walker through [employer].  she had declined referral to [facility] for physical therapy.  she is going to pursue medicaid application through [facility].  Provided medica alert information per request

## 2015-06-13 NOTE — Progress Notes (Signed)
Patient discharged via wheelchair and private vehicle. IV removed and catheter intact. All discharge instructions given and patient verbalizes understanding. Tele removed and returned. Prescriptions given to patient No distress noted. Patient given resources to get medications and resources for follow up physician. All questions asked and answered. Stable at time of discharge   

## 2015-06-13 NOTE — Clinical Social Work Note (Signed)
Clinical Social Work Assessment  Patient Details  Name: Anne Roy MRN: [id number] Date of Birth: February 20, 1960  Date of referral:  06/13/15               Reason for consult:  Discharge Planning                Permission sought to share information with:    Permission granted to share information::     Name::        Agency::     Relationship::     Contact Information:     Housing/Transportation Living arrangements for the past 2 months:  Single Family Home Source of Information:  Patient Patient Interpreter Needed:  None Criminal Activity/Legal Involvement Pertinent to Current Situation/Hospitalization:  No - Comment as needed Significant Relationships:  Adult Children, Other Family Members Lives with:  Self Do you feel safe going back to the place where you live?  Yes Need for family participation in patient care:  No (Coment)  Care giving concerns:  Patient wanted information about completing a Medicaid application.    Social Worker assessment / plan:  CSW was consulted stating that patient wanted information about completing a Medicaid application. CSW met with patient at bedside. Introduced herself and her role. Provided patient with the [facility] list with the [place] highlighted in red. Informed patient that she'd have to go to the Department of Social Services to complete a Medicaid application. Patient inquired if she can complete the application online. CSW informed her that she had to complete the application in person at the Department of Social Services. CSW provided patient with Medicaid FAQ information retrieved off of [place] Department of Social Services website. Patient reports that she'll go to the Department of Social Services to complete the application. There are no other CSW needs at this time. CSW will be available if a need were to arise. CSW is signing off.   Employment status:  Retired Forensic scientist:  Self  Pay (Medicaid Pending) PT Recommendations:  Not assessed at this time Information / Referral to community resources:  Other (Comment Required) ([place] List)  Patient/Family's Response to care: Patient was appreciative of information provided by CSW.   Patient/Family's Understanding of and Emotional Response to Diagnosis, Current Treatment, and Prognosis: Patient reports she understands why she was admitted into [facility].   Emotional Assessment Appearance:  Appears stated age Attitude/Demeanor/Rapport:   (None) Affect (typically observed):  Calm, Pleasant Orientation:  Oriented to Self, Oriented to Place, Oriented to  Time, Oriented to Situation Alcohol / Substance use:  Not Applicable Psych involvement (Current and /or in the community):  No (Comment)  Discharge Needs  Concerns to be addressed:  Discharge Planning Concerns Readmission within the last 30 days:  No Current discharge risk:  None Barriers to Discharge:  No Barriers Identified   [place], LCSW 06/13/2015, 9:07 AM

## 2015-06-13 NOTE — Progress Notes (Signed)
CSW was informed by RN that patient lives in [place]. CSW provided patient with [employer] and [email or web address] to access Medicaid application. CSW is signing off. There are no CSW needs to address at this time but CSW is available if a need were to arise.   Ernest Pine, MSW, [place] Social Work Department [phone]

## 2015-06-13 NOTE — Progress Notes (Signed)
Speech Language Pathology Treatment: Dysphagia (discussion of R labial/oral weakness)  Patient Details Name: Anne Roy MRN: [id number] DOB: Mar 20, 1960 Today's Date: 06/13/2015 Time: 1000-1038 SLP Time Calculation (min) (ACUTE ONLY): 38 min  Assessment / Plan / Recommendation Clinical Impression  Pt appears to adequately tolerate po's (per pt/Son report) and via assessment w/ thin liquid trials during tx session w/ no overt s/s of aspiration noted. Pt and Son were educated on OMEs to target labial/oral weakness (R side) as well as strategies to manage saliva and any labial residue/drooling. Education given on general aspiration precautions. Handouts given on above. More Family arrived for pt's discharge home.  Recommend pt continue w/ current diet w/ general aspiration precautions; f/u recommended w/ HH or Outpatient ST services to target R labial/oral weakness and Dysarthria of speech. Pt agreed and will f/u w/ MD, insurance coverage for such. Initial exercises given targeting OMEs and overarticulation.    HPI HPI: Pt is a 55 y.o. female with no significant PMH who comes to the emergency room secondary to balance problems and slurred speech. Patient is not seeing any physician. She stays at home and has several family members to attend to her. Recently she had nausea vomiting and upper GI illness. She was noted to be weak and off balance yesterday while walking which the family thought that it was secondary to weakness from her recent gastroenteritis. Her speech remained thick and slurred today, no word finding difficulties and she was noted to have a right facial droop and so brought to the emergency room. 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 Currently, pt is tolerating current regular diet; continues to present w/ R labial/oral weakness w/ min. drooling of saliva. Pt is awake/alert, conversive.       SLP Plan  Continue with current plan of care (recommend f/u w/ HH  or Outpatient ST services at discharge)     Recommendations  Diet recommendations: Regular;Thin liquid Liquids provided via: Cup;Straw Medication Administration: Whole meds with liquid Supervision: Patient able to self feed Compensations: Minimize environmental distractions;Slow rate;Small sips/bites;Lingual sweep for clearance of pocketing;Multiple dry swallows after each bite/sip Postural Changes and/or Swallow Maneuvers: Out of bed for meals;Seated upright 90 degrees             General recommendations: Rehab consult (at discharge for skilled ST services for Dysarthria) Oral Care Recommendations: Oral care BID;Patient independent with oral care Follow up Recommendations: Home health SLP;Outpatient SLP Plan: Continue with current plan of care (recommend f/u w/ [facility] or Outpatient ST services at discharge)     [place], Anne Roy, Anne Roy  Anne Roy 06/13/2015, 11:41 AM

## 2015-06-28 ENCOUNTER — Ambulatory Visit: Payer: Self-pay

## 2015-07-25 ENCOUNTER — Ambulatory Visit: Payer: Self-pay | Attending: Internal Medicine | Admitting: *Deleted

## 2015-07-25 ENCOUNTER — Encounter: Payer: Self-pay | Admitting: *Deleted

## 2015-07-25 ENCOUNTER — Ambulatory Visit
Admission: RE | Admit: 2015-07-25 | Discharge: 2015-07-25 | Disposition: A | Discharge status: Home / Self Care | Payer: Self-pay | Source: Ambulatory Visit | Attending: Internal Medicine | Admitting: Internal Medicine

## 2015-07-25 VITALS — BP 140/84 | HR 96 | Temp 98.3°F | Resp 20 | Ht 63.39 in | Wt 124.3 lb

## 2015-07-25 DIAGNOSIS — Z0000 Encounter for general adult medical examination without abnormal findings: Secondary | ICD-10-CM

## 2015-07-25 NOTE — Patient Instructions (Signed)
Gave patient hand-out, Women Staying Healthy, Active and Well from [facility], with education on breast health, pap smears, heart and colon health. 

## 2015-07-25 NOTE — Progress Notes (Signed)
Subjective:     Patient ID: Anne Roy, female   DOB: 1960-03-08, 55 y.o.   MRN: [id number]  HPI   Review of Systems     Objective:   Physical Exam  Pulmonary/Chest: Right breast exhibits no inverted nipple, no mass, no nipple discharge, no skin change and no tenderness. Left breast exhibits no inverted nipple, no mass, no nipple discharge, no skin change and no tenderness. Breasts are asymmetrical.  Right breast larger than the left breast  Abdominal: There is no splenomegaly or hepatomegaly.  Genitourinary: No breast swelling, tenderness, discharge or bleeding. No labial fusion. There is no rash, tenderness, lesion or injury on the right labia. There is no rash, tenderness, lesion or injury on the left labia. Cervix exhibits no motion tenderness, no discharge and no friability. Right adnexum displays no mass, no tenderness and no fullness. Left adnexum displays no mass, no tenderness and no fullness. No erythema, tenderness or bleeding in the vagina. No foreign body around the vagina. No signs of injury around the vagina. No vaginal discharge found.         Assessment:     55 year old Black female presents to [facility] for clinical breast exam and pap smear.  Patient states she had a stroke last month with right sided weakness.  She is doing well today.  Clinical breast exam unremarkable.  Taught self breast awareness.  Specimen collected for pap smear.  Patient has been screened for eligibility.  She does not have any insurance, Medicare or Medicaid.  She also meets financial eligibility.  Hand-out given on the Affordable Care Act.     Plan:     Screening mammogram ordered.  Specimen sent to the lab.  Will follow-up per [facility] protocol.

## 2015-07-26 LAB — PAP LB AND HPV HIGH-RISK
HPV, high-risk: NEGATIVE
PAP Smear Comment: 0

## 2015-08-01 NOTE — Progress Notes (Signed)
Letter mailed to patient to notify of normal mammogram, and pap smear results.  Copy to [facility]. 

## 2015-09-05 DIAGNOSIS — I639 Cerebral infarction, unspecified: Secondary | ICD-10-CM | POA: Insufficient documentation

## 2017-02-12 ENCOUNTER — Other Ambulatory Visit: Payer: Self-pay

## 2017-02-12 ENCOUNTER — Encounter (HOSPITAL_COMMUNITY): Payer: Self-pay | Admitting: Emergency Medicine

## 2017-02-12 ENCOUNTER — Ambulatory Visit (HOSPITAL_COMMUNITY)
Admission: EM | Admit: 2017-02-12 | Discharge: 2017-02-12 | Disposition: A | Discharge status: Home / Self Care | Payer: Medicaid Other | Attending: Family Medicine | Admitting: Family Medicine

## 2017-02-12 DIAGNOSIS — L509 Urticaria, unspecified: Secondary | ICD-10-CM

## 2017-02-12 HISTORY — DX: Type 2 diabetes mellitus without complications: E11.9

## 2017-02-12 HISTORY — DX: Essential (primary) hypertension: I10

## 2017-02-12 MED ORDER — PREDNISONE 10 MG (21) PO TBPK: ORAL_TABLET | Freq: Every day | ORAL | 0 refills | Status: AC

## 2017-02-12 NOTE — ED Triage Notes (Signed)
Rash on face that patient noticed to days ago.  Woke this morning with lip swelling and rash across top of shoulders and itching all over

## 2017-02-16 NOTE — ED Provider Notes (Signed)
  [place]   [id number] 02/12/17 Arrival Time: 1129  ASSESSMENT & PLAN:  1. Urticaria     Meds ordered this encounter  Medications  . predniSONE (STERAPRED UNI-PAK 21 TAB) 10 MG (21) TBPK tablet    Sig: Take by mouth daily. Take as directed.    Dispense:  21 tablet    Refill:  0   Will f/u with PCP or here if not seeing significant improvement over the next 24-48 hours. Benadryl if needed.  Reviewed expectations re: course of current medical issues. Questions answered. Outlined signs and symptoms indicating need for more acute intervention. Patient verbalized understanding. After Visit Summary given.   SUBJECTIVE:  Anne Roy is a 57 y.o. female who presents with complaint of:   Rash Patient presents for evaluation of a localized rash involving her face and shoulders/neck Onset gradual, a few days ago. Reports that the rash comes and goes. Associated symptoms: itching. She denies: arthralgia, fever, nausea and vomiting. Reports that she has not had contacts with similar rash. She has not had new exposures (soaps, lotions, laundry detergents, foods, medications, plants, insects or animals). She has not identified precipitant. Environmental exposures or allergies: none No recent travel. No OTC treatment.  ROS: As per HPI.  OBJECTIVE: Vitals:   02/12/17 1239  BP: (!) 166/74  Pulse: 88  Resp: 20  Temp: 97.8 F (36.6 C)  TempSrc: Oral  SpO2: 100%    General appearance: alert; no distress Lungs: clear to auscultation bilaterally Heart: regular rate and rhythm Extremities: no edema Skin: warm and dry; multiple wheals over face and neck; no obvious lip swelling Psychological: alert and cooperative; normal mood and affect  No Known Allergies  Past Medical History:  Diagnosis Date  . Diabetes mellitus without complication ([place])   . Hypertension    Social History   Socioeconomic History  . Marital status: Married    Spouse name: Not on file  .  Number of children: Not on file  . Years of education: Not on file  . Highest education level: Not on file  Social Needs  . Financial resource strain: Not on file  . Food insecurity - worry: Not on file  . Food insecurity - inability: Not on file  . Transportation needs - medical: Not on file  . Transportation needs - non-medical: Not on file  Occupational History  . Not on file  Tobacco Use  . Smoking status: Former Research scientist (life sciences)  . Smokeless tobacco: Never Used  . Tobacco comment: Quit > 20 years ago  Substance and Sexual Activity  . Alcohol use: No    Alcohol/week: 0.0 oz  . Drug use: No  . Sexual activity: Yes    Birth control/protection: Post-menopausal  Other Topics Concern  . Not on file  Social History Narrative   Living with family at home.   Family History  Problem Relation Age of Onset  . Diabetes Mother   . Hypertension Mother   . Diabetes Father   . Hypertension Father    Past Surgical History:  Procedure Laterality Date  . TUBAL LIGATION  1986     Vanessa Kick, MD 02/16/17 [phone]

## 2017-12-23 ENCOUNTER — Other Ambulatory Visit: Payer: Self-pay

## 2017-12-23 ENCOUNTER — Encounter: Payer: Self-pay | Admitting: Emergency Medicine

## 2017-12-23 ENCOUNTER — Emergency Department
Admission: EM | Admit: 2017-12-23 | Discharge: 2017-12-23 | Disposition: A | Discharge status: Home / Self Care | Payer: No Typology Code available for payment source | Attending: Emergency Medicine | Admitting: Emergency Medicine

## 2017-12-23 ENCOUNTER — Emergency Department: Payer: No Typology Code available for payment source

## 2017-12-23 DIAGNOSIS — S29012A Strain of muscle and tendon of back wall of thorax, initial encounter: Secondary | ICD-10-CM | POA: Insufficient documentation

## 2017-12-23 DIAGNOSIS — Y9389 Activity, other specified: Secondary | ICD-10-CM | POA: Diagnosis not present

## 2017-12-23 DIAGNOSIS — E119 Type 2 diabetes mellitus without complications: Secondary | ICD-10-CM | POA: Insufficient documentation

## 2017-12-23 DIAGNOSIS — S199XXA Unspecified injury of neck, initial encounter: Secondary | ICD-10-CM | POA: Diagnosis present

## 2017-12-23 DIAGNOSIS — Y929 Unspecified place or not applicable: Secondary | ICD-10-CM | POA: Insufficient documentation

## 2017-12-23 DIAGNOSIS — Z87891 Personal history of nicotine dependence: Secondary | ICD-10-CM | POA: Diagnosis not present

## 2017-12-23 DIAGNOSIS — Y998 Other external cause status: Secondary | ICD-10-CM | POA: Diagnosis not present

## 2017-12-23 DIAGNOSIS — S161XXA Strain of muscle, fascia and tendon at neck level, initial encounter: Secondary | ICD-10-CM

## 2017-12-23 DIAGNOSIS — I10 Essential (primary) hypertension: Secondary | ICD-10-CM | POA: Diagnosis not present

## 2017-12-23 MED ORDER — IBUPROFEN 400 MG PO TABS
600.0000 mg | ORAL_TABLET | Freq: Once | ORAL | Status: AC
  Administered 2017-12-23: 600 mg via ORAL
  Filled 2017-12-23: qty 2
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
## 2017-12-23 NOTE — Discharge Instructions (Addendum)
You may alternate Tylenol and Motrin for pain.  You may also use a heating pad for 15 minutes every 2 hours to decrease pain.  Return to the emergency department if you develop severe pain, lightheadedness, nausea or vomiting, numbness tingling or weakness, or any other symptoms concerning to you.

## 2018-01-18 ENCOUNTER — Ambulatory Visit: Payer: Medicare HMO

## 2018-04-28 ENCOUNTER — Ambulatory Visit: Payer: Medicare HMO

## 2018-08-03 ENCOUNTER — Other Ambulatory Visit: Payer: Self-pay | Admitting: Family Medicine

## 2018-08-03 DIAGNOSIS — Z1231 Encounter for screening mammogram for malignant neoplasm of breast: Secondary | ICD-10-CM

## 2018-09-09 ENCOUNTER — Ambulatory Visit
Admission: RE | Admit: 2018-09-09 | Discharge: 2018-09-09 | Disposition: A | Discharge status: Home / Self Care | Payer: Medicare HMO | Source: Ambulatory Visit | Attending: Family Medicine | Admitting: Family Medicine

## 2018-09-09 DIAGNOSIS — Z1231 Encounter for screening mammogram for malignant neoplasm of breast: Secondary | ICD-10-CM | POA: Diagnosis present

## 2018-09-21 ENCOUNTER — Other Ambulatory Visit: Payer: Self-pay | Admitting: Family Medicine

## 2018-09-21 DIAGNOSIS — R928 Other abnormal and inconclusive findings on diagnostic imaging of breast: Secondary | ICD-10-CM

## 2018-09-21 DIAGNOSIS — R921 Mammographic calcification found on diagnostic imaging of breast: Secondary | ICD-10-CM

## 2018-09-24 ENCOUNTER — Ambulatory Visit
Admission: RE | Admit: 2018-09-24 | Discharge: 2018-09-24 | Disposition: A | Discharge status: Home / Self Care | Payer: Medicare HMO | Source: Ambulatory Visit | Attending: Family Medicine | Admitting: Family Medicine

## 2018-09-24 DIAGNOSIS — R928 Other abnormal and inconclusive findings on diagnostic imaging of breast: Secondary | ICD-10-CM | POA: Diagnosis present

## 2018-09-24 DIAGNOSIS — R921 Mammographic calcification found on diagnostic imaging of breast: Secondary | ICD-10-CM

## 2018-10-04 ENCOUNTER — Other Ambulatory Visit: Payer: Self-pay | Admitting: Family Medicine

## 2018-10-04 DIAGNOSIS — R921 Mammographic calcification found on diagnostic imaging of breast: Secondary | ICD-10-CM

## 2018-10-04 DIAGNOSIS — R928 Other abnormal and inconclusive findings on diagnostic imaging of breast: Secondary | ICD-10-CM

## 2018-10-11 ENCOUNTER — Ambulatory Visit: Payer: Medicare HMO

## 2018-10-26 ENCOUNTER — Ambulatory Visit
Admission: RE | Admit: 2018-10-26 | Discharge: 2018-10-26 | Disposition: A | Discharge status: Home / Self Care | Payer: Medicare HMO | Source: Ambulatory Visit | Attending: Family Medicine | Admitting: Family Medicine

## 2018-10-26 ENCOUNTER — Other Ambulatory Visit: Payer: Self-pay | Admitting: Family Medicine

## 2018-10-26 DIAGNOSIS — R921 Mammographic calcification found on diagnostic imaging of breast: Secondary | ICD-10-CM

## 2018-10-26 DIAGNOSIS — R928 Other abnormal and inconclusive findings on diagnostic imaging of breast: Secondary | ICD-10-CM

## 2018-10-26 HISTORY — PX: BREAST BIOPSY: SHX20

## 2018-11-04 ENCOUNTER — Other Ambulatory Visit: Payer: Self-pay | Admitting: Family Medicine

## 2018-11-04 DIAGNOSIS — R921 Mammographic calcification found on diagnostic imaging of breast: Secondary | ICD-10-CM

## 2019-07-21 ENCOUNTER — Ambulatory Visit
Admission: RE | Admit: 2019-07-21 | Discharge: 2019-07-21 | Disposition: A | Discharge status: Home / Self Care | Payer: Medicare HMO | Source: Ambulatory Visit | Attending: Family Medicine | Admitting: Family Medicine

## 2019-07-21 DIAGNOSIS — R921 Mammographic calcification found on diagnostic imaging of breast: Secondary | ICD-10-CM | POA: Diagnosis not present

## 2019-07-26 ENCOUNTER — Other Ambulatory Visit: Payer: Self-pay | Admitting: Family Medicine

## 2019-07-26 DIAGNOSIS — R921 Mammographic calcification found on diagnostic imaging of breast: Secondary | ICD-10-CM

## 2020-11-23 ENCOUNTER — Encounter: Payer: Self-pay | Admitting: *Deleted

## 2020-11-26 ENCOUNTER — Ambulatory Visit: Payer: Medicare HMO | Admitting: Certified Registered Nurse Anesthetist

## 2020-11-26 ENCOUNTER — Encounter: Payer: Self-pay | Admitting: *Deleted

## 2020-11-26 ENCOUNTER — Encounter
Admission: RE | Disposition: A | Discharge status: Home / Self Care | Payer: Self-pay | Source: Home / Self Care | Attending: Gastroenterology

## 2020-11-26 ENCOUNTER — Other Ambulatory Visit: Payer: Self-pay

## 2020-11-26 ENCOUNTER — Ambulatory Visit
Admission: RE | Admit: 2020-11-26 | Discharge: 2020-11-26 | Disposition: A | Discharge status: Home / Self Care | Payer: Medicare HMO | Attending: Gastroenterology | Admitting: Gastroenterology

## 2020-11-26 DIAGNOSIS — Z1211 Encounter for screening for malignant neoplasm of colon: Secondary | ICD-10-CM | POA: Diagnosis present

## 2020-11-26 DIAGNOSIS — E785 Hyperlipidemia, unspecified: Secondary | ICD-10-CM | POA: Insufficient documentation

## 2020-11-26 DIAGNOSIS — Z79899 Other long term (current) drug therapy: Secondary | ICD-10-CM | POA: Diagnosis not present

## 2020-11-26 DIAGNOSIS — K635 Polyp of colon: Secondary | ICD-10-CM | POA: Diagnosis not present

## 2020-11-26 DIAGNOSIS — D125 Benign neoplasm of sigmoid colon: Secondary | ICD-10-CM | POA: Insufficient documentation

## 2020-11-26 DIAGNOSIS — Z87891 Personal history of nicotine dependence: Secondary | ICD-10-CM | POA: Insufficient documentation

## 2020-11-26 DIAGNOSIS — I10 Essential (primary) hypertension: Secondary | ICD-10-CM | POA: Diagnosis not present

## 2020-11-26 DIAGNOSIS — I69351 Hemiplegia and hemiparesis following cerebral infarction affecting right dominant side: Secondary | ICD-10-CM | POA: Insufficient documentation

## 2020-11-26 DIAGNOSIS — D123 Benign neoplasm of transverse colon: Secondary | ICD-10-CM | POA: Diagnosis not present

## 2020-11-26 DIAGNOSIS — D175 Benign lipomatous neoplasm of intra-abdominal organs: Secondary | ICD-10-CM | POA: Diagnosis not present

## 2020-11-26 HISTORY — PX: COLONOSCOPY WITH PROPOFOL: SHX5780

## 2020-11-26 HISTORY — DX: Cerebral infarction, unspecified: I63.9

## 2020-11-26 HISTORY — DX: Hyperlipidemia, unspecified: E78.5

## 2020-11-26 SURGERY — COLONOSCOPY WITH PROPOFOL
Anesthesia: General

## 2020-11-26 MED ORDER — PROPOFOL 500 MG/50ML IV EMUL
INTRAVENOUS | Status: AC
  Filled 2020-11-26: qty 50

## 2020-11-26 MED ORDER — PROPOFOL 500 MG/50ML IV EMUL
INTRAVENOUS | Status: DC | PRN
Start: 2020-11-26 — End: 2020-11-26
  Administered 2020-11-26: 150 ug/kg/min via INTRAVENOUS

## 2020-11-26 MED ORDER — SODIUM CHLORIDE 0.9 % IV SOLN: INTRAVENOUS | Status: DC

## 2020-11-26 MED ORDER — PROPOFOL 10 MG/ML IV BOLUS
INTRAVENOUS | Status: DC | PRN
  Administered 2020-11-26: 50 mg via INTRAVENOUS

## 2020-11-26 MED ORDER — LIDOCAINE HCL (PF) 2 % IJ SOLN
INTRAMUSCULAR | Status: AC
  Filled 2020-11-26: qty 5

## 2020-11-26 MED ORDER — LIDOCAINE HCL (CARDIAC) PF 100 MG/5ML IV SOSY
PREFILLED_SYRINGE | INTRAVENOUS | Status: DC | PRN
  Administered 2020-11-26: 25 mg via INTRAVENOUS

## 2020-11-26 NOTE — Anesthesia Postprocedure Evaluation (Signed)
Anesthesia Post Note  Patient: Anne Roy  Procedure(s) Performed: COLONOSCOPY WITH PROPOFOL  Patient location during evaluation: Phase II Anesthesia Type: General Level of consciousness: awake and alert, awake and oriented Pain management: pain level controlled Vital Signs Assessment: post-procedure vital signs reviewed and stable Respiratory status: spontaneous breathing, nonlabored ventilation and respiratory function stable Cardiovascular status: blood pressure returned to baseline and stable Postop Assessment: no apparent nausea or vomiting Anesthetic complications: no   No notable events documented.   Last Vitals:  Vitals:   11/26/20 0913 11/26/20 0923  BP: 112/76 94/75  Pulse: 78 81  Resp: 17 16  Temp:    SpO2: 100% 100%    Last Pain:  Vitals:   11/26/20 0923  TempSrc:   PainSc: 0-No pain                 Phill Mutter

## 2020-11-26 NOTE — Interval H&P Note (Signed)
History and Physical Interval Note:  11/26/2020 8:38 AM  Anne Roy  has presented today for surgery, with the diagnosis of HISTORY OF ADENOMATOUS POLYP OF COLON.  The various methods of treatment have been discussed with the patient and family. After consideration of risks, benefits and other options for treatment, the patient has consented to  Procedure(s): COLONOSCOPY WITH PROPOFOL (N/A) as a surgical intervention.  The patient's history has been reviewed, patient examined, no change in status, stable for surgery.  I have reviewed the patient's chart and labs.  Questions were answered to the patient's satisfaction.     Lesly Rubenstein  Ok to proceed with colonoscopy

## 2020-11-26 NOTE — Anesthesia Preprocedure Evaluation (Addendum)
Anesthesia Evaluation  Patient identified by MRN, date of birth, ID band Patient awake    Reviewed: Allergy & Precautions, H&P , NPO status , Patient's Chart, lab work & pertinent test results  Airway Mallampati: II  TM Distance: >3 FB Neck ROM: Full    Dental  (+) Edentulous Upper, Edentulous Lower   Pulmonary neg pulmonary ROS, former smoker,    Pulmonary exam normal        Cardiovascular hypertension, Pt. on medications negative cardio ROS Normal cardiovascular exam     Neuro/Psych CVA (R side paralysis, resolved), No Residual Symptoms negative psych ROS   GI/Hepatic negative GI ROS, Neg liver ROS, Bowel prep,  Endo/Other  negative endocrine ROS  Renal/GU negative Renal ROS  negative genitourinary   Musculoskeletal negative musculoskeletal ROS (+)   Abdominal   Peds negative pediatric ROS (+)  Hematology negative hematology ROS (+)   Anesthesia Other Findings . CVA (cerebral vascular accident) (CMS-HCC)  . HTN (hypertension)  . Hyperlipidemia     Reproductive/Obstetrics negative OB ROS                           Anesthesia Physical Anesthesia Plan  ASA: 3  Anesthesia Plan: General   Post-op Pain Management:    Induction: Intravenous  PONV Risk Score and Plan: 2 and Propofol infusion and TIVA  Airway Management Planned: Natural Airway and Nasal Cannula  Additional Equipment:   Intra-op Plan:   Post-operative Plan:   Informed Consent: I have reviewed the patients History and Physical, chart, labs and discussed the procedure including the risks, benefits and alternatives for the proposed anesthesia with the patient or authorized representative who has indicated his/her understanding and acceptance.       Plan Discussed with: CRNA, Surgeon and Anesthesiologist  Anesthesia Plan Comments:         Anesthesia Quick Evaluation

## 2020-11-26 NOTE — Op Note (Signed)
[facility] Gastroenterology Patient Name: Anne Roy Procedure Date: 11/26/2020 8:29 AM MRN: [id number] Account #: [id number] Date of Birth: January 05, 1961 Admit Type: Outpatient Age: 60 Room: [facility] ENDO ROOM 3 Gender: Female Note Status: Finalized Instrument Name: Peds Colonoscope [id number] Procedure:             Colonoscopy Indications:           Surveillance: Personal history of adenomatous polyps                         on last colonoscopy > 5 years ago Providers:             Andrey Farmer MD, MD Medicines:             Monitored Anesthesia Care Complications:         No immediate complications. Estimated blood loss:                         Minimal. Procedure:             Pre-Anesthesia Assessment:                        - Prior to the procedure, a History and Physical was                         performed, and patient medications and allergies were                         reviewed. The patient is competent. The risks and                         benefits of the procedure and the sedation options and                         risks were discussed with the patient. All questions                         were answered and informed consent was obtained.                         Patient identification and proposed procedure were                         verified by the physician, the nurse, the anesthetist                         and the technician in the endoscopy suite. Mental                         Status Examination: alert and oriented. Airway                         Examination: normal oropharyngeal airway and neck                         mobility. Respiratory Examination: clear to                         auscultation. CV Examination: normal. Prophylactic  Antibiotics: The patient does not require prophylactic                         antibiotics. Prior Anticoagulants: The patient has                         taken no previous anticoagulant or  antiplatelet agents                         except for aspirin. ASA Grade Assessment: II - A                         patient with mild systemic disease. After reviewing                         the risks and benefits, the patient was deemed in                         satisfactory condition to undergo the procedure. The                         anesthesia plan was to use monitored anesthesia care                         (MAC). Immediately prior to administration of                         medications, the patient was re-assessed for adequacy                         to receive sedatives. The heart rate, respiratory                         rate, oxygen saturations, blood pressure, adequacy of                         pulmonary ventilation, and response to care were                         monitored throughout the procedure. The physical                         status of the patient was re-assessed after the                         procedure.                        After obtaining informed consent, the colonoscope was                         passed under direct vision. Throughout the procedure,                         the patient's blood pressure, pulse, and oxygen                         saturations were monitored continuously. The  Colonoscope was introduced through the anus and                         advanced to the the cecum, identified by appendiceal                         orifice and ileocecal valve. The colonoscopy was                         performed without difficulty. The patient tolerated                         the procedure well. The quality of the bowel                         preparation was good. Findings:      The perianal and digital rectal examinations were normal.      There was a small lipoma, 10 mm in diameter, in the ascending colon.      A 2 mm polyp was found in the ascending colon. The polyp was sessile.       The polyp was removed with a jumbo  cold forceps. Resection and retrieval       were complete. Estimated blood loss was minimal.      A 3 mm polyp was found in the hepatic flexure. The polyp was sessile.       The polyp was removed with a cold snare. Resection and retrieval were       complete. Estimated blood loss was minimal.      A 3 mm polyp was found in the transverse colon. The polyp was sessile.       The polyp was removed with a cold snare. Resection and retrieval were       complete. Estimated blood loss was minimal.      A 2 mm polyp was found in the sigmoid colon. The polyp was sessile. The       polyp was removed with a cold snare. Resection and retrieval were       complete. Estimated blood loss was minimal.      The exam was otherwise without abnormality on direct and retroflexion       views. Impression:            - Small lipoma in the ascending colon.                        - One 2 mm polyp in the ascending colon, removed with                         a jumbo cold forceps. Resected and retrieved.                        - One 3 mm polyp at the hepatic flexure, removed with                         a cold snare. Resected and retrieved.                        - One 3 mm polyp in the transverse colon, removed with  a cold snare. Resected and retrieved.                        - One 2 mm polyp in the sigmoid colon, removed with a                         cold snare. Resected and retrieved.                        - The examination was otherwise normal on direct and                         retroflexion views. Recommendation:        - Discharge patient to home.                        - Resume previous diet.                        - Continue present medications.                        - Await pathology results.                        - Repeat colonoscopy in 3 years for surveillance.                        - Return to referring physician as previously                         scheduled. Procedure  Code(s):     --- Professional ---                        [phone], Colonoscopy, flexible; with removal of                         tumor(s), polyp(s), or other lesion(s) by snare                         technique                        45380, 86, Colonoscopy, flexible; with biopsy, single                         or multiple Diagnosis Code(s):     --- Professional ---                        Z86.010, Personal history of colonic polyps                        D17.5, Benign lipomatous neoplasm of intra-abdominal                         organs                        K63.5, Polyp of colon CPT copyright 2019 American Medical Association. All rights reserved. The codes documented in this report are preliminary and upon coder review may  be revised to meet current compliance requirements. Andrey Farmer MD, MD 11/26/2020 9:04:35 AM Number of Addenda: 0 Note Initiated On: 11/26/2020 8:29 AM Scope Withdrawal Time: 0 hours 9 minutes 44 seconds  Total Procedure Duration: 0 hours 15 minutes 49 seconds  Estimated Blood Loss:  Estimated blood loss was minimal.      [facility]

## 2020-11-26 NOTE — Transfer of Care (Signed)
Immediate Anesthesia Transfer of Care Note  Patient: Anne Roy  Procedure(s) Performed: COLONOSCOPY WITH PROPOFOL  Patient Location: Endoscopy Unit  Anesthesia Type:General  Level of Consciousness: drowsy  Airway & Oxygen Therapy: Patient Spontanous Breathing  Post-op Assessment: Report given to RN and Post -op Vital signs reviewed and stable  Post vital signs: Reviewed and stable  Last Vitals:  Vitals Value Taken Time  BP 94/64 11/26/20 0903  Temp    Pulse 83 11/26/20 0904  Resp 19 11/26/20 0904  SpO2 99 % 11/26/20 0904  Vitals shown include unvalidated device data.  Last Pain:  Vitals:   11/26/20 0736  TempSrc: Temporal  PainSc: 0-No pain         Complications: No notable events documented.

## 2020-11-26 NOTE — H&P (Signed)
Outpatient short stay form Pre-procedure 11/26/2020  Lesly Rubenstein, MD  Primary Physician: [facility]  Reason for visit:  Surveillance colonoscopy  History of present illness:   60 y/o lady with history of hypertension and history of multiple adenomatous polyps on colonoscopy done in 2011 here for f/u colonoscopy. Was previously recommended to have 1 year f/u but has not followed up until now. History of tubal ligation. No family history of GI malignancies. No blood thinners. No new symptoms.    Current Facility-Administered Medications:    0.9 %  sodium chloride infusion, , Intravenous, Continuous, Marieliz Strang, Hilton Cork, MD, Last Rate: 20 mL/hr at 11/26/20 0748, New Bag at 11/26/20 0748  Medications Prior to Admission  Medication Sig Dispense Refill Last Dose   amLODipine (NORVASC) 10 MG tablet Take 1 tablet (10 mg total) by mouth daily. 30 tablet 0 11/25/2020   aspirin EC 325 MG tablet Take 1 tablet (325 mg total) by mouth daily. 30 tablet 0 11/25/2020   atorvastatin (LIPITOR) 40 MG tablet Take 1 tablet (40 mg total) by mouth daily at 6 PM. 30 tablet 0 11/25/2020   hydrALAZINE (APRESOLINE) 25 MG tablet Take 1 tablet (25 mg total) by mouth every 8 (eight) hours. 90 tablet 0 11/25/2020   hydrochlorothiazide (HYDRODIURIL) 25 MG tablet Take 25 mg by mouth daily.   11/25/2020   ibuprofen (ADVIL,MOTRIN) 200 MG tablet Take 400 mg by mouth every 6 (six) hours as needed for headache or mild pain.   Past Month   Multiple Vitamin (MULTIVITAMIN) tablet Take 1 tablet by mouth daily.   11/25/2020   guaifenesin (ROBITUSSIN) 100 MG/5ML syrup Take 200 mg by mouth every 4 (four) hours as needed for cough or congestion. (Patient not taking: Reported on 11/26/2020)   Not Taking   NON FORMULARY  (Patient not taking: Reported on 11/26/2020)   Not Taking   predniSONE (STERAPRED UNI-PAK 21 TAB) 10 MG (21) TBPK tablet Take by mouth daily. Take as directed. (Patient not taking: Reported on  11/26/2020) 21 tablet 0 Not Taking     No Known Allergies   Past Medical History:  Diagnosis Date   Hyperlipidemia    Hypertension    Stroke [facility])     Review of systems:  Otherwise negative.    Physical Exam  Gen: Alert, oriented. Appears stated age.  HEENT: PERRLA. Lungs: No respiratory distress CV: RRR Abd: soft, benign, no masses Ext: No edema    Planned procedures: Proceed with colonoscopy. The patient understands the nature of the planned procedure, indications, risks, alternatives and potential complications including but not limited to bleeding, infection, perforation, damage to internal organs and possible oversedation/side effects from anesthesia. The patient agrees and gives consent to proceed.  Please refer to procedure notes for findings, recommendations and patient disposition/instructions.     Lesly Rubenstein, MD [facility] Gastroenterology

## 2020-11-27 ENCOUNTER — Encounter: Payer: Self-pay | Admitting: Gastroenterology

## 2020-11-27 LAB — SURGICAL PATHOLOGY

## 2021-05-06 ENCOUNTER — Other Ambulatory Visit (INDEPENDENT_AMBULATORY_CARE_PROVIDER_SITE_OTHER): Payer: Self-pay | Admitting: Vascular Surgery

## 2021-05-06 ENCOUNTER — Ambulatory Visit (INDEPENDENT_AMBULATORY_CARE_PROVIDER_SITE_OTHER): Payer: Medicare HMO

## 2021-05-06 ENCOUNTER — Ambulatory Visit (INDEPENDENT_AMBULATORY_CARE_PROVIDER_SITE_OTHER): Payer: Medicare HMO | Admitting: Vascular Surgery

## 2021-05-06 ENCOUNTER — Other Ambulatory Visit (INDEPENDENT_AMBULATORY_CARE_PROVIDER_SITE_OTHER): Payer: Self-pay | Admitting: Nurse Practitioner

## 2021-05-06 ENCOUNTER — Encounter (INDEPENDENT_AMBULATORY_CARE_PROVIDER_SITE_OTHER): Payer: Self-pay | Admitting: Vascular Surgery

## 2021-05-06 DIAGNOSIS — I10 Essential (primary) hypertension: Secondary | ICD-10-CM

## 2021-05-06 DIAGNOSIS — I739 Peripheral vascular disease, unspecified: Secondary | ICD-10-CM

## 2021-05-06 DIAGNOSIS — I70213 Atherosclerosis of native arteries of extremities with intermittent claudication, bilateral legs: Secondary | ICD-10-CM

## 2021-05-06 DIAGNOSIS — E7800 Pure hypercholesterolemia, unspecified: Secondary | ICD-10-CM | POA: Insufficient documentation

## 2021-05-06 DIAGNOSIS — M79604 Pain in right leg: Secondary | ICD-10-CM | POA: Diagnosis not present

## 2021-05-06 DIAGNOSIS — M79606 Pain in leg, unspecified: Secondary | ICD-10-CM | POA: Insufficient documentation

## 2021-05-06 DIAGNOSIS — M79605 Pain in left leg: Secondary | ICD-10-CM

## 2021-05-06 DIAGNOSIS — I70219 Atherosclerosis of native arteries of extremities with intermittent claudication, unspecified extremity: Secondary | ICD-10-CM | POA: Insufficient documentation

## 2021-05-06 NOTE — Progress Notes (Signed)
? ? ? ? ?MRN : [id number] ? ?Anne Roy is a 61 y.o. (12-17-1960) female who presents with chief complaint of check circulation. ? ?History of Present Illness:  ? ? The patient is seen for evaluation of painful lower extremities and diminished pulses. Patient notes the pain is always associated with activity and is very consistent day today. Typically, the pain occurs at less than one block, progress is as activity continues to the point that the patient must stop walking. Resting including standing still for several minutes allows the patient to walk a similar distance before being forced to stop again. Uneven terrain and inclines shorten the distance. The pain has been progressive over the past several years. The patient denies any abrupt changes in claudication symptoms.  The patient states the inability to walk is causing problems with daily activities. ? ?The patient denies rest pain or dangling of an extremity off the side of the bed during the night for relief. ?No open wounds or sores at this time. ?No prior interventions or surgeries. ? ?No history of back problems or DJD of the lumbar sacral spine.  ? ?The patient's blood pressure has been stable and relatively well controlled. ?The patient denies amaurosis fugax or recent TIA symptoms. There are no recent neurological changes noted. ?The patient denies history of DVT, PE or superficial thrombophlebitis. ?The patient denies recent episodes of angina or shortness of breath.   ? ?No outpatient medications have been marked as taking for the 05/06/21 encounter (Appointment) with Delana Meyer, Dolores Lory, MD.  ? ? ?Past Medical History:  ?Diagnosis Date  ? Hyperlipidemia   ? Hypertension   ? Stroke [facility])   ? ? ?Past Surgical History:  ?Procedure Laterality Date  ? COLONOSCOPY    ? COLONOSCOPY WITH PROPOFOL N/A 11/26/2020  ? Procedure: COLONOSCOPY WITH PROPOFOL;  Surgeon: Lesly Rubenstein, MD;  Location: [facility] ENDOSCOPY;  Service: Endoscopy;  Laterality: N/A;  ?  TUBAL LIGATION  1986  ? ? ?Social History ?Social History  ? ?Tobacco Use  ? Smoking status: Former  ?  Types: Cigarettes  ?  Quit date: 53  ?  Years since quitting: 33.3  ? Smokeless tobacco: Never  ? Tobacco comments:  ?  Quit > 20 years ago  ?Vaping Use  ? Vaping Use: Never used  ?Substance Use Topics  ? Alcohol use: Not Currently  ? Drug use: Never  ? ? ?Family History ?Family History  ?Problem Relation Age of Onset  ? Diabetes Mother   ? Hypertension Mother   ? Diabetes Father   ? Hypertension Father   ? Breast cancer Neg Hx   ? ? ?No Known Allergies ? ? ?REVIEW OF SYSTEMS (Negative unless checked) ? ?Constitutional: '[]'$ Weight loss  '[]'$ Fever  '[]'$ Chills ?Cardiac: '[]'$ Chest pain   '[]'$ Chest pressure   '[]'$ Palpitations   '[]'$ Shortness of breath when laying flat   '[]'$ Shortness of breath with exertion. ?Vascular:  '[x]'$ Pain in legs with walking   '[]'$ Pain in legs at rest  '[]'$ History of DVT   '[]'$ Phlebitis   '[]'$ Swelling in legs   '[]'$ Varicose veins   '[]'$ Non-healing ulcers ?Pulmonary:   '[]'$ Uses home oxygen   '[]'$ Productive cough   '[]'$ Hemoptysis   '[]'$ Wheeze  '[]'$ COPD   '[]'$ Asthma ?Neurologic:  '[]'$ Dizziness   '[]'$ Seizures   '[]'$ History of stroke   '[]'$ History of TIA  '[]'$ Aphasia   '[]'$ Vissual changes   '[]'$ Weakness or numbness in arm   '[]'$ Weakness or numbness in leg ?Musculoskeletal:   '[]'$ Joint swelling   '[]'$ Joint pain   '[]'$   Low back pain ?Hematologic:  '[]'$ Easy bruising  '[]'$ Easy bleeding   '[]'$ Hypercoagulable state   '[]'$ Anemic ?Gastrointestinal:  '[]'$ Diarrhea   '[]'$ Vomiting  '[]'$ Gastroesophageal reflux/heartburn   '[]'$ Difficulty swallowing. ?Genitourinary:  '[]'$ Chronic kidney disease   '[]'$ Difficult urination  '[]'$ Frequent urination   '[]'$ Blood in urine ?Skin:  '[]'$ Rashes   '[]'$ Ulcers  ?Psychological:  '[]'$ History of anxiety   '[]'$  History of major depression. ? ?Physical Examination ? ?There were no vitals filed for this visit. ?There is no height or weight on file to calculate BMI. ?Gen: WD/WN, NAD ?Head: [place]/AT, No temporalis wasting.  ?Ear/Nose/Throat: Hearing grossly intact, nares w/o  erythema or drainage ?Eyes: PER, EOMI, sclera nonicteric.  ?Neck: Supple, no masses.  No bruit or JVD.  ?Pulmonary:  Good air movement, no audible wheezing, no use of accessory muscles.  ?Cardiac: RRR, normal S1, S2, no Murmurs. ?Vascular:  mild trophic changes, no open wounds; no carotid bruits noted ?Vessel Right Left  ?Radial Palpable Palpable  ?PT Not Palpable Not Palpable  ?DP Not Palpable Not Palpable  ?Gastrointestinal: soft, non-distended. No guarding/no peritoneal signs.  ?Musculoskeletal: M/S 5/5 throughout.  No visible deformity.  ?Neurologic: CN 2-12 intact. Pain and light touch intact in extremities.  Symmetrical.  Speech is fluent. Motor exam as listed above. ?Psychiatric: Judgment intact, Mood & affect appropriate for pt's clinical situation. ?Dermatologic: No rashes or ulcers noted.  No changes consistent with cellulitis. ? ? ?CBC ?Lab Results  ?Component Value Date  ? WBC 14.3 (H) 06/12/2015  ? HGB 12.7 06/12/2015  ? HCT 38.4 06/12/2015  ? MCV 78.4 (L) 06/12/2015  ? PLT 333 06/12/2015  ? ? ?BMET ?   ?Component Value Date/Time  ? NA 138 06/12/2015 0416  ? K 3.6 06/12/2015 0416  ? CL 108 06/12/2015 0416  ? CO2 25 06/12/2015 0416  ? GLUCOSE 107 (H) 06/12/2015 0416  ? BUN 11 06/12/2015 0416  ? CREATININE 0.71 06/12/2015 0416  ? CALCIUM 8.5 (L) 06/12/2015 0416  ? GFRNONAA >60 06/12/2015 0416  ? GFRAA >60 06/12/2015 0416  ? ?CrCl cannot be calculated (Patient's most recent lab result is older than the maximum 21 days allowed.). ? ?COAG ?Lab Results  ?Component Value Date  ? INR 1.01 06/11/2015  ? ? ?Radiology ?No results found. ? ? ?Assessment/Plan ?1. PVD (peripheral vascular disease) ([place]) ? Recommend: ? ?The patient has evidence of atherosclerosis of the lower extremities with claudication.  The patient does not voice lifestyle limiting changes at this point in time. ? ?Noninvasive studies do not suggest clinically significant change. ? ?No invasive studies, angiography or surgery at this time ?The  patient should continue walking and begin a more formal exercise program.  ?The patient should continue antiplatelet therapy and aggressive treatment of the lipid abnormalities ? ?No changes in the patient's medications at this time ? ?Continued surveillance is indicated as atherosclerosis is likely to progress with time.   ? ?The patient will continue follow up with noninvasive studies as ordered.   ?- VAS [place] ABI WITH/WO TBI ? ?2. Pain in both lower extremities ? Recommend: ? ?The patient has evidence of atherosclerosis of the lower extremities with claudication.  The patient does not voice lifestyle limiting changes at this point in time. ? ?Noninvasive studies do not suggest clinically significant change. ? ?No invasive studies, angiography or surgery at this time ?The patient should continue walking and begin a more formal exercise program.  ?The patient should continue antiplatelet therapy and aggressive treatment of the lipid abnormalities ? ?No changes in  the patient's medications at this time ? ?Continued surveillance is indicated as atherosclerosis is likely to progress with time.   ? ?The patient will continue follow up with noninvasive studies as ordered.   ? ?3. Atherosclerosis of native artery of both lower extremities with intermittent claudication ([place]) ? Recommend: ? ?The patient has evidence of atherosclerosis of the lower extremities with claudication.  The patient does not voice lifestyle limiting changes at this point in time. ? ?Noninvasive studies do not suggest clinically significant change. ? ?No invasive studies, angiography or surgery at this time ?The patient should continue walking and begin a more formal exercise program.  ?The patient should continue antiplatelet therapy and aggressive treatment of the lipid abnormalities ? ?No changes in the patient's medications at this time ? ?Continued surveillance is indicated as atherosclerosis is likely to progress with time.   ? ?The patient  will continue follow up with noninvasive studies as ordered.   ?- VAS [place] ABI WITH/WO TBI; Future ? ?4. Essential hypertension ?Continue antihypertensive medications as already ordered, these medications have been rev

## 2021-05-20 ENCOUNTER — Ambulatory Visit (INDEPENDENT_AMBULATORY_CARE_PROVIDER_SITE_OTHER): Payer: Medicare HMO | Admitting: Nurse Practitioner

## 2021-05-27 ENCOUNTER — Other Ambulatory Visit: Payer: Self-pay | Admitting: Family Medicine

## 2021-05-27 DIAGNOSIS — R921 Mammographic calcification found on diagnostic imaging of breast: Secondary | ICD-10-CM

## 2021-05-27 DIAGNOSIS — Z1231 Encounter for screening mammogram for malignant neoplasm of breast: Secondary | ICD-10-CM

## 2021-06-25 ENCOUNTER — Ambulatory Visit
Admission: RE | Admit: 2021-06-25 | Discharge: 2021-06-25 | Disposition: A | Discharge status: Home / Self Care | Payer: Medicare HMO | Source: Ambulatory Visit | Attending: Family Medicine | Admitting: Family Medicine

## 2021-06-25 ENCOUNTER — Ambulatory Visit: Payer: Medicare HMO

## 2021-06-25 DIAGNOSIS — R921 Mammographic calcification found on diagnostic imaging of breast: Secondary | ICD-10-CM | POA: Insufficient documentation

## 2021-06-25 DIAGNOSIS — Z1231 Encounter for screening mammogram for malignant neoplasm of breast: Secondary | ICD-10-CM | POA: Diagnosis present

## 2021-11-04 ENCOUNTER — Encounter (INDEPENDENT_AMBULATORY_CARE_PROVIDER_SITE_OTHER): Payer: Self-pay

## 2021-11-11 ENCOUNTER — Ambulatory Visit (INDEPENDENT_AMBULATORY_CARE_PROVIDER_SITE_OTHER): Payer: Medicare HMO | Admitting: Nurse Practitioner

## 2021-11-14 ENCOUNTER — Ambulatory Visit (INDEPENDENT_AMBULATORY_CARE_PROVIDER_SITE_OTHER): Payer: Medicare HMO | Admitting: Nurse Practitioner

## 2021-12-03 ENCOUNTER — Ambulatory Visit (INDEPENDENT_AMBULATORY_CARE_PROVIDER_SITE_OTHER): Payer: Medicare HMO | Admitting: Nurse Practitioner

## 2022-01-01 ENCOUNTER — Ambulatory Visit (INDEPENDENT_AMBULATORY_CARE_PROVIDER_SITE_OTHER): Payer: Medicare HMO | Admitting: Nurse Practitioner

## 2023-04-21 ENCOUNTER — Telehealth: Payer: Self-pay

## 2023-04-21 NOTE — Telephone Encounter (Signed)
 The patient called requesting to schedule an appointment. I informed her that she is a patient of [facility], and I transferred the call to [facility] office for further assistance.

## 2023-10-23 ENCOUNTER — Ambulatory Visit: Admitting: General Practice

## 2023-10-23 ENCOUNTER — Ambulatory Visit
Admission: RE | Admit: 2023-10-23 | Discharge: 2023-10-23 | Disposition: A | Discharge status: Home / Self Care | Attending: Gastroenterology | Admitting: Gastroenterology

## 2023-10-23 ENCOUNTER — Encounter
Admission: RE | Disposition: A | Discharge status: Home / Self Care | Payer: Self-pay | Source: Home / Self Care | Attending: Gastroenterology

## 2023-10-23 DIAGNOSIS — Z87891 Personal history of nicotine dependence: Secondary | ICD-10-CM | POA: Diagnosis not present

## 2023-10-23 DIAGNOSIS — Z79899 Other long term (current) drug therapy: Secondary | ICD-10-CM | POA: Insufficient documentation

## 2023-10-23 DIAGNOSIS — E785 Hyperlipidemia, unspecified: Secondary | ICD-10-CM | POA: Insufficient documentation

## 2023-10-23 DIAGNOSIS — D123 Benign neoplasm of transverse colon: Secondary | ICD-10-CM | POA: Diagnosis not present

## 2023-10-23 DIAGNOSIS — D120 Benign neoplasm of cecum: Secondary | ICD-10-CM | POA: Diagnosis not present

## 2023-10-23 DIAGNOSIS — I739 Peripheral vascular disease, unspecified: Secondary | ICD-10-CM | POA: Diagnosis not present

## 2023-10-23 DIAGNOSIS — I10 Essential (primary) hypertension: Secondary | ICD-10-CM | POA: Insufficient documentation

## 2023-10-23 DIAGNOSIS — Z1211 Encounter for screening for malignant neoplasm of colon: Secondary | ICD-10-CM | POA: Diagnosis present

## 2023-10-23 DIAGNOSIS — Z7952 Long term (current) use of systemic steroids: Secondary | ICD-10-CM | POA: Insufficient documentation

## 2023-10-23 DIAGNOSIS — Z8673 Personal history of transient ischemic attack (TIA), and cerebral infarction without residual deficits: Secondary | ICD-10-CM | POA: Insufficient documentation

## 2023-10-23 DIAGNOSIS — K640 First degree hemorrhoids: Secondary | ICD-10-CM | POA: Diagnosis not present

## 2023-10-23 DIAGNOSIS — D175 Benign lipomatous neoplasm of intra-abdominal organs: Secondary | ICD-10-CM | POA: Diagnosis not present

## 2023-10-23 HISTORY — PX: POLYPECTOMY: SHX149

## 2023-10-23 HISTORY — PX: COLONOSCOPY: SHX5424

## 2023-10-23 SURGERY — COLONOSCOPY
Anesthesia: General

## 2023-10-23 MED ORDER — GLYCOPYRROLATE 0.2 MG/ML IJ SOLN
INTRAMUSCULAR | Status: DC | PRN
  Administered 2023-10-23: .2 mg via INTRAVENOUS

## 2023-10-23 MED ORDER — PROPOFOL 10 MG/ML IV BOLUS
INTRAVENOUS | Status: DC | PRN
  Administered 2023-10-23: 20 mg via INTRAVENOUS
  Administered 2023-10-23: 40 mg via INTRAVENOUS

## 2023-10-23 MED ORDER — DEXMEDETOMIDINE HCL IN NACL 200 MCG/50ML IV SOLN
INTRAVENOUS | Status: DC | PRN
  Administered 2023-10-23: 8 ug via INTRAVENOUS
  Administered 2023-10-23: 4 ug via INTRAVENOUS

## 2023-10-23 MED ORDER — PROPOFOL 500 MG/50ML IV EMUL
INTRAVENOUS | Status: DC | PRN
  Administered 2023-10-23: 165 ug/kg/min via INTRAVENOUS

## 2023-10-23 MED ORDER — SODIUM CHLORIDE 0.9 % IV SOLN: INTRAVENOUS | Status: DC

## 2023-10-23 MED ORDER — LIDOCAINE HCL (CARDIAC) PF 100 MG/5ML IV SOSY
PREFILLED_SYRINGE | INTRAVENOUS | Status: DC | PRN
  Administered 2023-10-23: 100 mg via INTRAVENOUS

## 2023-10-23 NOTE — Anesthesia Procedure Notes (Signed)
 Procedure Name: General with mask airway Date/Time: 10/23/2023 9:00 AM  Performed by: Ledora Duncan, CRNAPre-anesthesia Checklist: Patient identified, Emergency Drugs available, Suction available and Patient being monitored Patient Re-evaluated:Patient Re-evaluated prior to induction Oxygen Delivery Method: Simple face mask Induction Type: IV induction Placement Confirmation: positive ETCO2 and breath sounds checked- equal and bilateral Dental Injury: Teeth and Oropharynx as per pre-operative assessment

## 2023-10-23 NOTE — Anesthesia Postprocedure Evaluation (Signed)
 Anesthesia Post Note  Patient: HASINA KREAGER  Procedure(s) Performed: COLONOSCOPY POLYPECTOMY, INTESTINE  Patient location during evaluation: Endoscopy Anesthesia Type: General Level of consciousness: awake and alert Pain management: pain level controlled Vital Signs Assessment: post-procedure vital signs reviewed and stable Respiratory status: spontaneous breathing, nonlabored ventilation, respiratory function stable and patient connected to nasal cannula oxygen Cardiovascular status: blood pressure returned to baseline and stable Postop Assessment: no apparent nausea or vomiting Anesthetic complications: no   No notable events documented.   Last Vitals:  Vitals:   10/23/23 0939 10/23/23 0945  BP:  117/75  Pulse: (!) 110 (!) 106  Resp: 19 (!) 24  Temp:    SpO2: 98% 100%    Last Pain:  Vitals:   10/23/23 0935  TempSrc: Temporal  PainSc:                  Debby Mines

## 2023-10-23 NOTE — H&P (Signed)
 Outpatient short stay form Pre-procedure 10/23/2023  Anne ONEIDA Schick, MD  Primary Physician: [facility]  Reason for visit:  Surveillance  History of present illness:    63 y/o lady with history of hypertension and HLD here for surveillance colonoscopy. Last colonoscopy in 2022 with small Ta's and SSA. No blood thinners. No family history of GI malignancies. No abdominal surgeries.    Current Facility-Administered Medications:    0.9 %  sodium chloride  infusion, , Intravenous, Continuous, Ryson Bacha, Anne ONEIDA, MD, Last Rate: 20 mL/hr at 10/23/23 0853, Continued from Pre-op at 10/23/23 0853  Medications Prior to Admission  Medication Sig Dispense Refill Last Dose/Taking   amLODipine  (NORVASC ) 10 MG tablet Take 1 tablet (10 mg total) by mouth daily. 30 tablet 0 10/22/2023   aspirin  EC 325 MG tablet Take 1 tablet (325 mg total) by mouth daily. (Patient taking differently: Take 81 mg by mouth daily.) 30 tablet 0 10/22/2023   atorvastatin  (LIPITOR) 40 MG tablet Take 1 tablet (40 mg total) by mouth daily at 6 PM. 30 tablet 0 Past Week   Multiple Vitamin (MULTIVITAMIN) tablet Take 1 tablet by mouth daily.   Past Week   guaifenesin  (ROBITUSSIN) 100 MG/5ML syrup Take 200 mg by mouth every 4 (four) hours as needed for cough or congestion. (Patient not taking: Reported on 11/26/2020)      hydrALAZINE  (APRESOLINE ) 25 MG tablet Take 1 tablet (25 mg total) by mouth every 8 (eight) hours. (Patient not taking: Reported on 05/06/2021) 90 tablet 0    hydrochlorothiazide (HYDRODIURIL) 25 MG tablet Take 25 mg by mouth daily.      ibuprofen  (ADVIL ,MOTRIN ) 200 MG tablet Take 400 mg by mouth every 6 (six) hours as needed for headache or mild pain.      NON FORMULARY  (Patient not taking: Reported on 11/26/2020)      predniSONE  (STERAPRED UNI-PAK 21 TAB) 10 MG (21) TBPK tablet Take by mouth daily. Take as directed. (Patient not taking: Reported on 11/26/2020) 21 tablet 0      No Known  Allergies   Past Medical History:  Diagnosis Date   Hyperlipidemia    Hypertension    Stroke [facility])     Review of systems:  Otherwise negative.    Physical Exam  Gen: Alert, oriented. Appears stated age.  HEENT: PERRLA. Lungs: No respiratory distress CV: RRR Abd: soft, benign, no masses Ext: No edema    Planned procedures: Proceed with colonoscopy. The patient understands the nature of the planned procedure, indications, risks, alternatives and potential complications including but not limited to bleeding, infection, perforation, damage to internal organs and possible oversedation/side effects from anesthesia. The patient agrees and gives consent to proceed.  Please refer to procedure notes for findings, recommendations and patient disposition/instructions.     Anne ONEIDA Schick, MD [facility] Gastroenterology

## 2023-10-23 NOTE — Interval H&P Note (Signed)
 History and Physical Interval Note:  10/23/2023 8:57 AM  Anne Roy  has presented today for surgery, with the diagnosis of hx of adenomatous polyp of colon.  The various methods of treatment have been discussed with the patient and family. After consideration of risks, benefits and other options for treatment, the patient has consented to  Procedure(s): COLONOSCOPY (N/A) as a surgical intervention.  The patient's history has been reviewed, patient examined, no change in status, stable for surgery.  I have reviewed the patient's chart and labs.  Questions were answered to the patient's satisfaction.     Ole ONEIDA Schick  Ok to proceed with colonoscopy

## 2023-10-23 NOTE — Op Note (Signed)
 [facility] Patient Name: Anne Roy Procedure Date: 10/23/2023 8:48 AM MRN: [id number] Account #: [id number] Date of Birth: 03/07/1960 Admit Type: Outpatient Age: 63 Room: [facility] ENDO ROOM 3 Gender: Female Note Status: Finalized Instrument Name: Colon Scope [id number] Procedure:             Colonoscopy Indications:           Surveillance: Personal history of adenomatous polyps                         on last colonoscopy 3 years ago Providers:             Ole Schick MD, MD Referring MD:          [facility] Medicines:             Monitored Anesthesia Care Complications:         No immediate complications. Estimated blood loss:                         Minimal. Procedure:             Pre-Anesthesia Assessment:                        - Prior to the procedure, a History and Physical was                         performed, and patient medications and allergies were                         reviewed. The patient is competent. The risks and                         benefits of the procedure and the sedation options and                         risks were discussed with the patient. All questions                         were answered and informed consent was obtained.                         Patient identification and proposed procedure were                         verified by the physician, the nurse, the                         anesthesiologist, the anesthetist and the technician                         in the endoscopy suite. Mental Status Examination:                         alert and oriented. Airway Examination: normal                         oropharyngeal airway and neck mobility. Respiratory  Examination: clear to auscultation. CV Examination:                         normal. Prophylactic Antibiotics: The patient does not                         require prophylactic antibiotics. Prior                          Anticoagulants: The patient has taken no anticoagulant                         or antiplatelet agents. ASA Grade Assessment: III - A                         patient with severe systemic disease. After reviewing                         the risks and benefits, the patient was deemed in                         satisfactory condition to undergo the procedure. The                         anesthesia plan was to use monitored anesthesia care                         (MAC). Immediately prior to administration of                         medications, the patient was re-assessed for adequacy                         to receive sedatives. The heart rate, respiratory                         rate, oxygen saturations, blood pressure, adequacy of                         pulmonary ventilation, and response to care were                         monitored throughout the procedure. The physical                         status of the patient was re-assessed after the                         procedure.                        After obtaining informed consent, the colonoscope was                         passed under direct vision. Throughout the procedure,                         the patient's blood pressure, pulse, and oxygen  saturations were monitored continuously. The                         Colonoscope was introduced through the anus and                         advanced to the the cecum, identified by appendiceal                         orifice and ileocecal valve. The colonoscopy was                         performed without difficulty. The patient tolerated                         the procedure well. The quality of the bowel                         preparation was good. The ileocecal valve, appendiceal                         orifice, and rectum were photographed. Findings:      The perianal and digital rectal examinations were normal.      Two sessile polyps were found in the cecum. The  polyps were 1 to 3 mm in       size. These polyps were removed with a cold snare. Resection and       retrieval were complete. Estimated blood loss was minimal.      There was a medium-sized lipoma, in the ascending colon.      A 10 mm polyp was found in the hepatic flexure. The polyp was       semi-pedunculated. The polyp was removed with a hot snare. Resection and       retrieval were complete. Estimated blood loss was minimal.      Three sessile polyps were found in the transverse colon. The polyps were       2 to 3 mm in size. These polyps were removed with a cold snare.       Resection and retrieval were complete. Estimated blood loss was minimal.      Internal hemorrhoids were found during retroflexion. The hemorrhoids       were Grade I (internal hemorrhoids that do not prolapse).      The exam was otherwise without abnormality on direct and retroflexion       views. Impression:            - Two 1 to 3 mm polyps in the cecum, removed with a                         cold snare. Resected and retrieved.                        - Medium-sized lipoma in the ascending colon.                        - One 10 mm polyp at the hepatic flexure, removed with                         a hot snare. Resected  and retrieved.                        - Three 2 to 3 mm polyps in the transverse colon,                         removed with a cold snare. Resected and retrieved.                        - Internal hemorrhoids.                        - The examination was otherwise normal on direct and                         retroflexion views. Recommendation:        - Discharge patient to home.                        - Resume previous diet.                        - Continue present medications.                        - Await pathology results.                        - Repeat colonoscopy in 3 years for surveillance.                        - Return to referring physician as previously                          scheduled. Procedure Code(s):     --- Professional ---                        [phone], Colonoscopy, flexible; with removal of                         tumor(s), polyp(s), or other lesion(s) by snare                         technique Diagnosis Code(s):     --- Professional ---                        Z86.010, Personal history of colonic polyps                        D12.0, Benign neoplasm of cecum                        D12.3, Benign neoplasm of transverse colon (hepatic                         flexure or splenic flexure)                        D17.5, Benign lipomatous neoplasm of intra-abdominal  organs                        K64.0, First degree hemorrhoids CPT copyright 2022 [employer]. All rights reserved. The codes documented in this report are preliminary and upon coder review may  be revised to meet current compliance requirements. Ole Schick MD, MD 10/23/2023 9:32:36 AM Number of Addenda: 0 Note Initiated On: 10/23/2023 8:48 AM Scope Withdrawal Time: 0 hours 19 minutes 49 seconds  Total Procedure Duration: 0 hours 24 minutes 48 seconds  Estimated Blood Loss:  Estimated blood loss was minimal.      [facility]

## 2023-10-23 NOTE — Transfer of Care (Signed)
 Immediate Anesthesia Transfer of Care Note  Patient: Anne Roy  Procedure(s) Performed: COLONOSCOPY POLYPECTOMY, INTESTINE  Patient Location: Endoscopy Unit  Anesthesia Type:General  Level of Consciousness: drowsy and patient cooperative  Airway & Oxygen Therapy: Patient Spontanous Breathing and Patient connected to face mask oxygen  Post-op Assessment: Report given to RN and Post -op Vital signs reviewed and stable  Post vital signs: Reviewed and stable  Last Vitals:  Vitals Value Taken Time  BP 107/85 10/23/23 09:33  Temp    Pulse 129 10/23/23 09:34  Resp 17 10/23/23 09:34  SpO2 100 % 10/23/23 09:34  Vitals shown include unfiled device data.  Last Pain:  Vitals:   10/23/23 0801  TempSrc: Temporal  PainSc: 0-No pain         Complications: No notable events documented.

## 2023-10-23 NOTE — Anesthesia Preprocedure Evaluation (Signed)
 Anesthesia Evaluation  Patient identified by MRN, date of birth, ID band Patient awake    Reviewed: Allergy & Precautions, H&P , NPO status , Patient's Chart, lab work & pertinent test results  Airway Mallampati: II  TM Distance: >3 FB Neck ROM: Full    Dental  (+) Edentulous Upper, Edentulous Lower   Pulmonary neg pulmonary ROS, former smoker   Pulmonary exam normal        Cardiovascular hypertension, Pt. on medications + Peripheral Vascular Disease  negative cardio ROS Normal cardiovascular exam     Neuro/Psych CVA (R side paralysis, resolved), No Residual Symptoms negative neurological ROS  negative psych ROS   GI/Hepatic negative GI ROS, Neg liver ROS, Bowel prep,,,  Endo/Other  negative endocrine ROS    Renal/GU negative Renal ROS  negative genitourinary   Musculoskeletal   Abdominal   Peds  Hematology negative hematology ROS (+)   Anesthesia Other Findings Past Medical History: No date: Hyperlipidemia No date: Hypertension No date: Stroke [facility])  Past Surgical History: No date: COLONOSCOPY 11/26/2020: COLONOSCOPY WITH PROPOFOL ; N/A     Comment:  Procedure: COLONOSCOPY WITH PROPOFOL ;  Surgeon:               Maryruth Ole DASEN, MD;  Location: [facility] ENDOSCOPY;                Service: Endoscopy;  Laterality: N/A; 1986: TUBAL LIGATION  BMI    Body Mass Index: 22.92 kg/m      Reproductive/Obstetrics negative OB ROS                              Anesthesia Physical Anesthesia Plan  ASA: 3  Anesthesia Plan: General   Post-op Pain Management: Minimal or no pain anticipated   Induction: Intravenous  PONV Risk Score and Plan: 2 and Propofol  infusion and TIVA  Airway Management Planned: Nasal Cannula  Additional Equipment: None  Intra-op Plan:   Post-operative Plan:   Informed Consent: I have reviewed the patients History and Physical, chart, labs and discussed the  procedure including the risks, benefits and alternatives for the proposed anesthesia with the patient or authorized representative who has indicated his/her understanding and acceptance.     Dental advisory given  Plan Discussed with: CRNA and Surgeon  Anesthesia Plan Comments: (Discussed risks of anesthesia with patient, including possibility of difficulty with spontaneous ventilation under anesthesia necessitating airway intervention, PONV, and rare risks such as cardiac or respiratory or neurological events, and allergic reactions. Discussed the role of CRNA in patient's perioperative care. Patient understands.)        Anesthesia Quick Evaluation

## 2023-10-26 LAB — SURGICAL PATHOLOGY

## 2023-12-17 IMAGING — MG DIGITAL DIAGNOSTIC BILAT W/ TOMO W/ CAD
8 of 12 series · 8 of 32 positions shown · non-contrast
Comparison: Previous exams, most recently 07/21/2019.

CLINICAL DATA: Interval follow-up of likely benign calcifications
in the RIGHT breast. Annual evaluation, LEFT breast.

EXAM:
DIGITAL DIAGNOSTIC BILATERAL MAMMOGRAM WITH TOMOSYNTHESIS AND CAD
TECHNIQUE: Bilateral digital diagnostic mammography and breast tomosynthesis
was performed. The images were evaluated with computer-aided
detection.

[R ML]
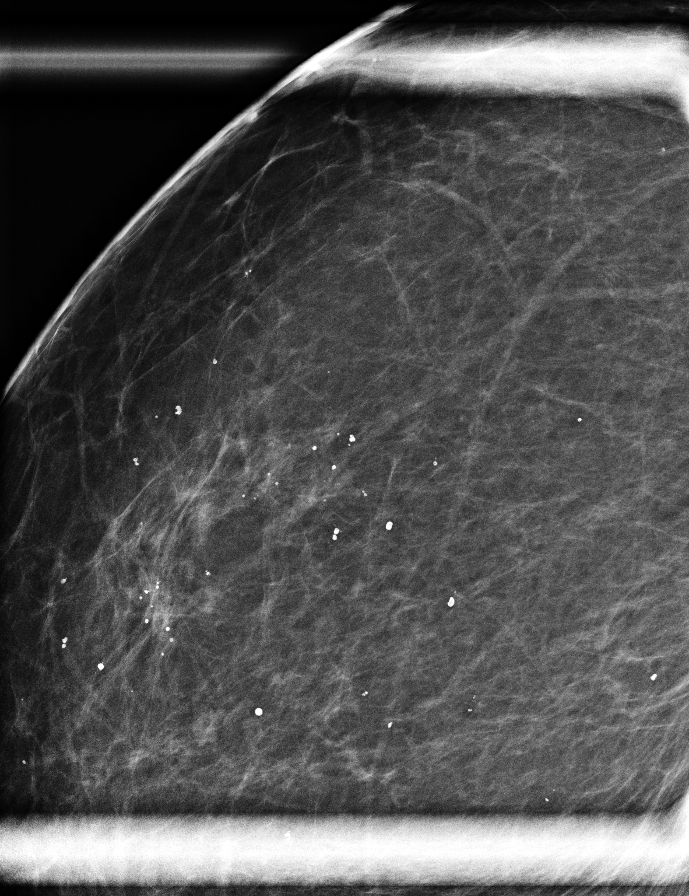

[R CC]
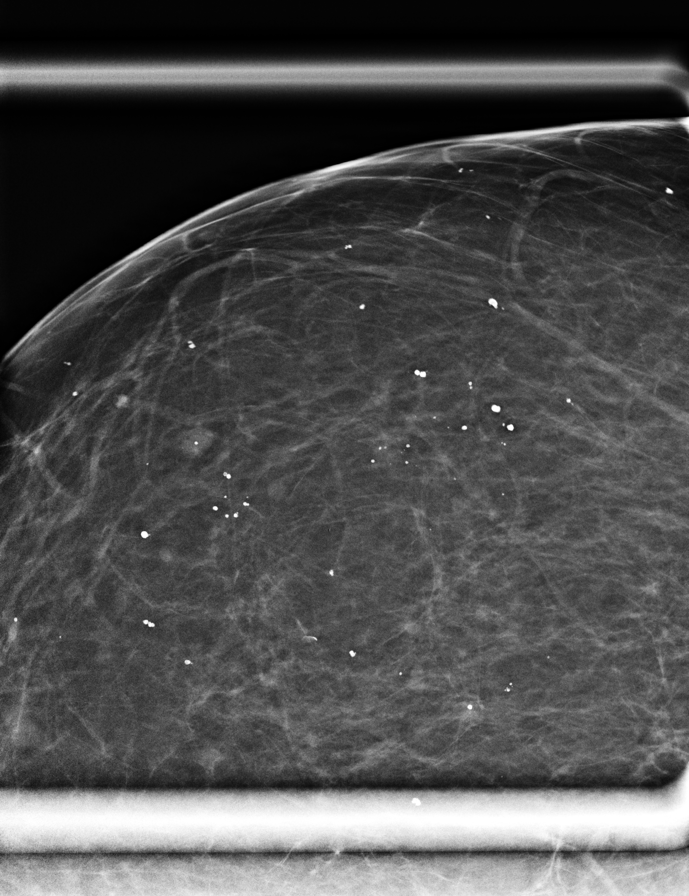

[L CC synth-2D]
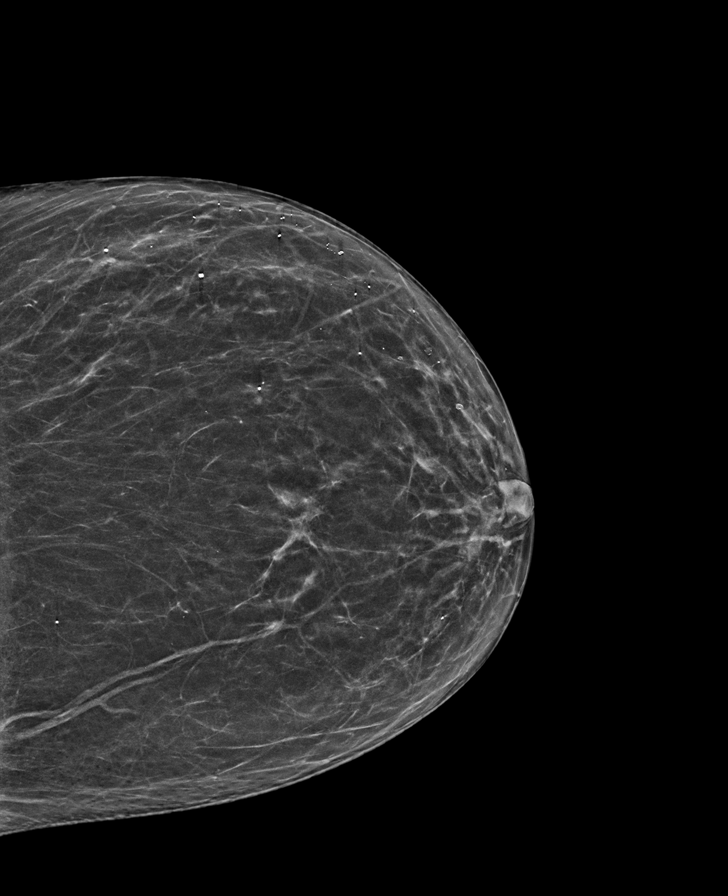

[R ML synth-2D]
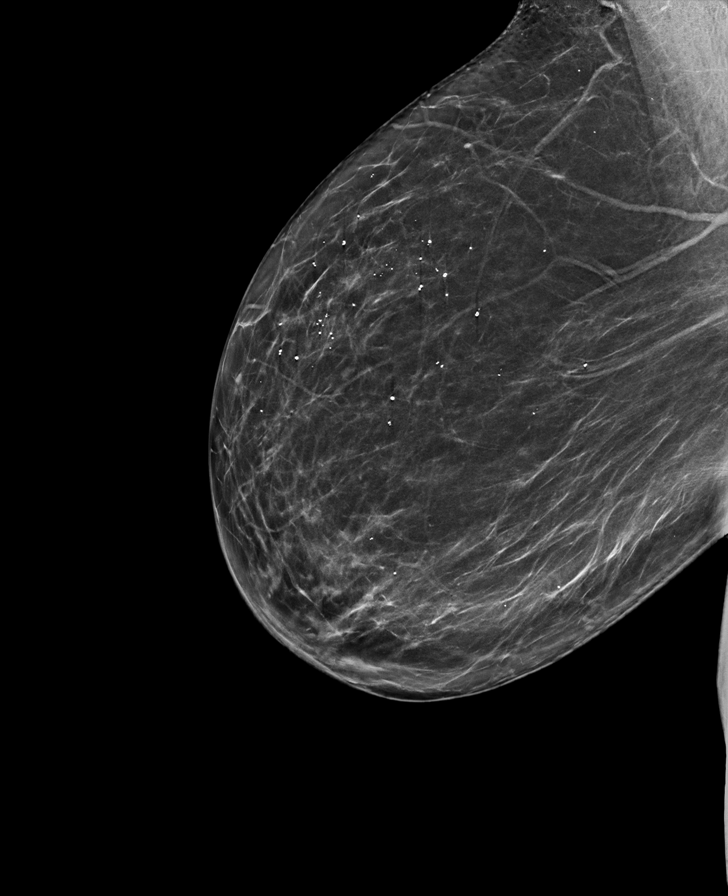

[L MLO synth-2D]
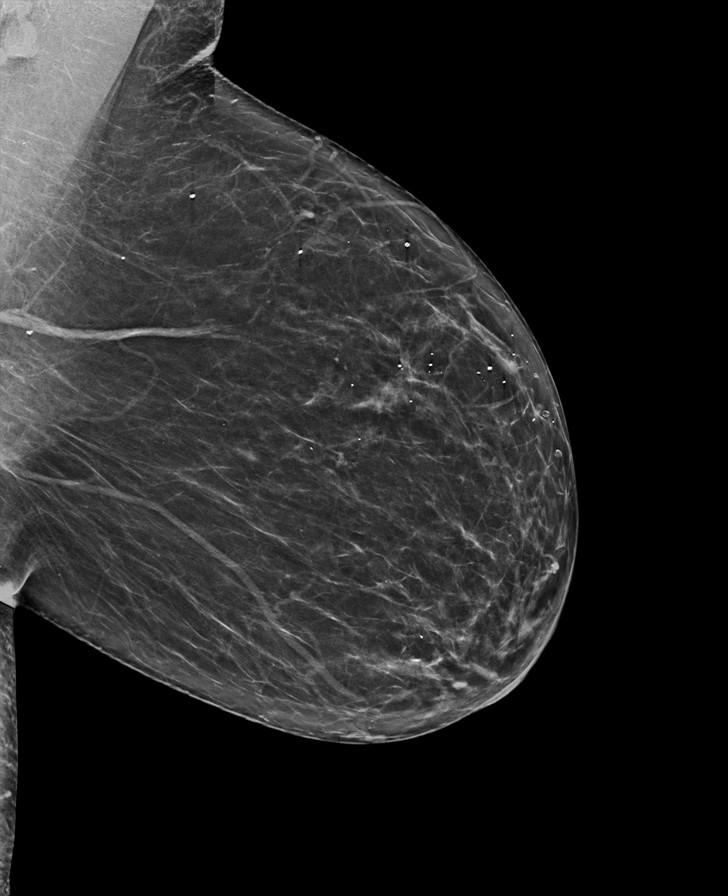

[R MLO synth-2D]
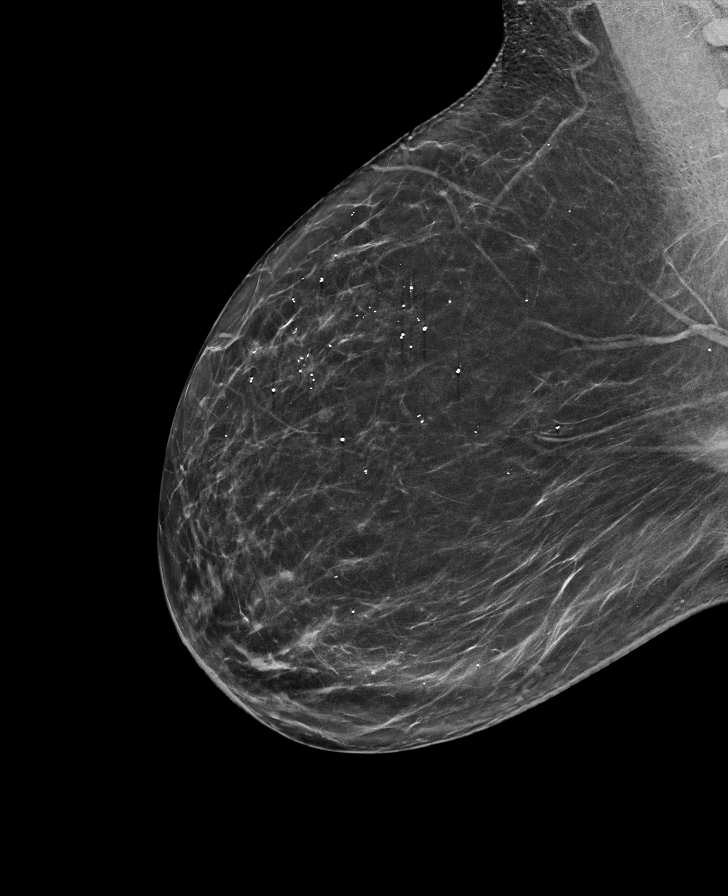

[R CC synth-2D]
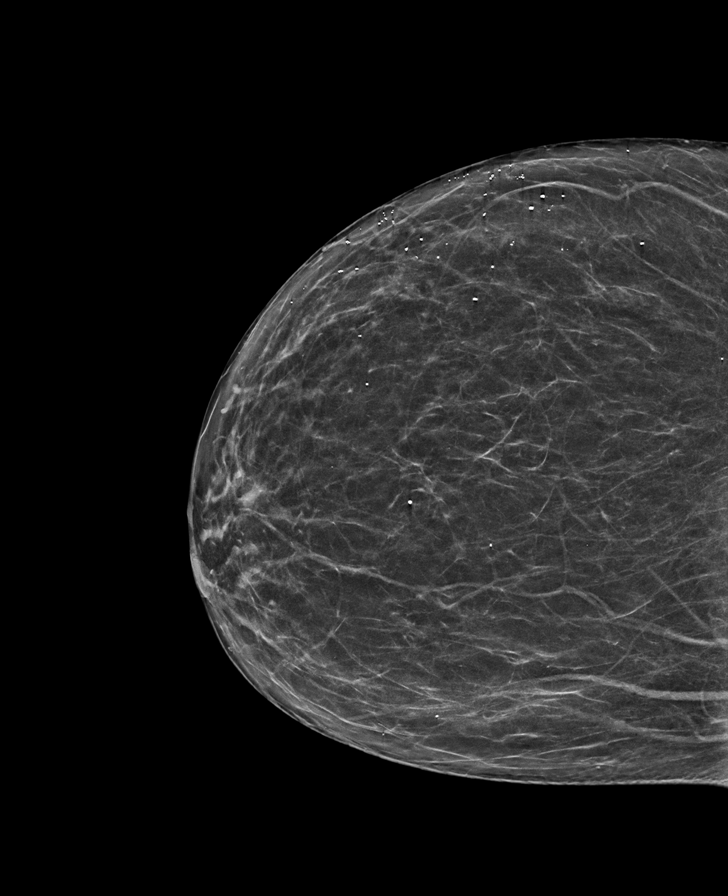

[R CC tomo · tomo slice 33/64.0]
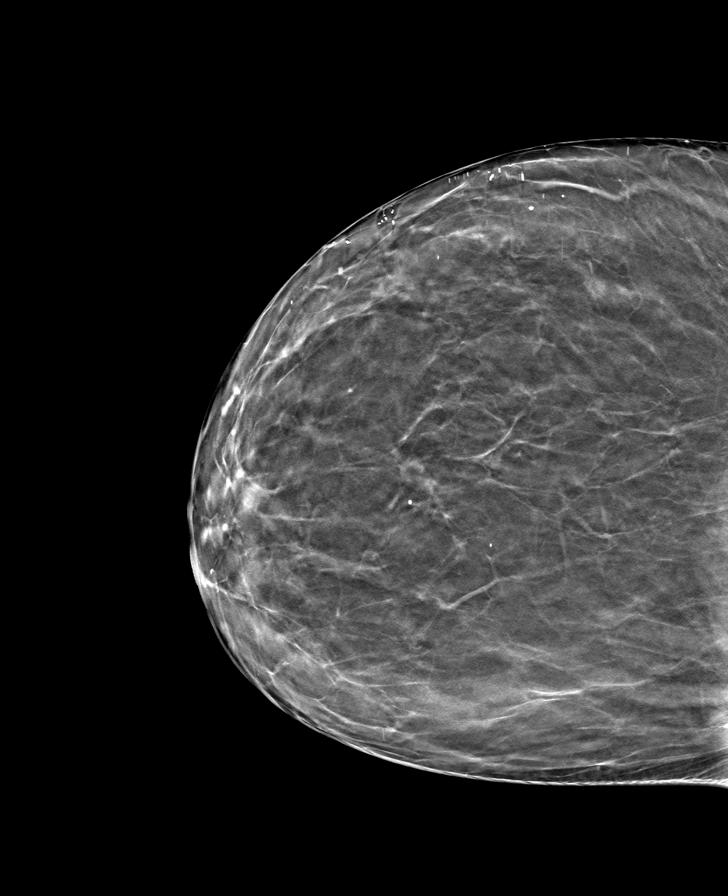

[8 of 32 positions shown; findings below may reference images not displayed]

ACR Breast Density Category b: There are scattered areas of
fibroglandular density.
FINDINGS: Full field CC and MLO views of both breasts and spot magnification
views of the RIGHT breast calcifications were obtained.

RIGHT: No findings suspicious for malignancy. The calcifications in
the upper-outer breast are benign dermal calcifications. There are
scattered benign dermal calcifications elsewhere. There are no
suspicious linear or branching forms.

LEFT: No findings suspicious for malignancy. Scattered benign dermal
calcifications.
IMPRESSION: No mammographic evidence of malignancy involving either breast.

RECOMMENDATION:
Screening mammogram in one year.(Code:5K-3-ZC0)

I have discussed the findings and recommendations with the patient.
If applicable, a reminder letter will be sent to the patient
regarding the next appointment.

BI-RADS CATEGORY  2: Benign.
